# Patient Record
Sex: Female | Born: 1970
Health system: Southern US, Community
[De-identification: ages and names within clinical notes are randomized; demographics above are authoritative.]

## PROBLEM LIST (undated history)

## (undated) DIAGNOSIS — F329 Major depressive disorder, single episode, unspecified: Secondary | ICD-10-CM

## (undated) DIAGNOSIS — I10 Essential (primary) hypertension: Secondary | ICD-10-CM

## (undated) DIAGNOSIS — B019 Varicella without complication: Secondary | ICD-10-CM

## (undated) DIAGNOSIS — R7611 Nonspecific reaction to tuberculin skin test without active tuberculosis: Secondary | ICD-10-CM

## (undated) DIAGNOSIS — F32A Depression, unspecified: Secondary | ICD-10-CM

## (undated) DIAGNOSIS — Z113 Encounter for screening for infections with a predominantly sexual mode of transmission: Secondary | ICD-10-CM

## (undated) HISTORY — DX: Depression, unspecified: F32.A

## (undated) HISTORY — DX: Encounter for screening for infections with a predominantly sexual mode of transmission: Z11.3

## (undated) HISTORY — DX: Varicella without complication: B01.9

## (undated) HISTORY — PX: OOPHORECTOMY: SHX86

## (undated) HISTORY — DX: Major depressive disorder, single episode, unspecified: F32.9

## (undated) HISTORY — DX: Nonspecific reaction to tuberculin skin test without active tuberculosis: R76.11

---

## 1995-07-15 HISTORY — PX: BREAST BIOPSY: SHX20

## 1999-04-27 ENCOUNTER — Emergency Department (HOSPITAL_COMMUNITY): Admission: EM | Admit: 1999-04-27 | Discharge: 1999-04-27 | Payer: Self-pay | Admitting: *Deleted

## 1999-05-08 ENCOUNTER — Emergency Department (HOSPITAL_COMMUNITY): Admission: EM | Admit: 1999-05-08 | Discharge: 1999-05-08 | Payer: Self-pay | Admitting: Emergency Medicine

## 1999-09-09 ENCOUNTER — Other Ambulatory Visit: Admission: RE | Admit: 1999-09-09 | Discharge: 1999-09-09 | Payer: Self-pay | Admitting: Obstetrics

## 2000-08-22 ENCOUNTER — Encounter: Payer: Self-pay | Admitting: Emergency Medicine

## 2000-08-22 ENCOUNTER — Emergency Department (HOSPITAL_COMMUNITY): Admission: EM | Admit: 2000-08-22 | Discharge: 2000-08-22 | Payer: Self-pay | Admitting: Emergency Medicine

## 2000-10-25 ENCOUNTER — Ambulatory Visit (HOSPITAL_COMMUNITY): Admission: RE | Admit: 2000-10-25 | Discharge: 2000-10-25 | Payer: Self-pay | Admitting: Orthopedic Surgery

## 2000-10-25 ENCOUNTER — Encounter: Payer: Self-pay | Admitting: Orthopedic Surgery

## 2001-04-19 ENCOUNTER — Other Ambulatory Visit: Admission: RE | Admit: 2001-04-19 | Discharge: 2001-04-19 | Payer: Self-pay | Admitting: Emergency Medicine

## 2003-09-08 ENCOUNTER — Emergency Department (HOSPITAL_COMMUNITY): Admission: EM | Admit: 2003-09-08 | Discharge: 2003-09-08 | Payer: Self-pay | Admitting: Emergency Medicine

## 2006-05-09 ENCOUNTER — Emergency Department (HOSPITAL_COMMUNITY): Admission: EM | Admit: 2006-05-09 | Discharge: 2006-05-09 | Payer: Self-pay | Admitting: *Deleted

## 2009-01-01 ENCOUNTER — Other Ambulatory Visit: Admission: RE | Admit: 2009-01-01 | Discharge: 2009-01-01 | Payer: Self-pay | Admitting: Family Medicine

## 2010-01-31 ENCOUNTER — Other Ambulatory Visit: Admission: RE | Admit: 2010-01-31 | Discharge: 2010-01-31 | Payer: Self-pay | Admitting: *Deleted

## 2010-06-19 ENCOUNTER — Ambulatory Visit
Admission: AD | Admit: 2010-06-19 | Discharge: 2010-06-19 | Payer: Self-pay | Source: Home / Self Care | Admitting: Obstetrics and Gynecology

## 2010-07-14 DEATH — deceased

## 2010-09-24 LAB — CBC
HCT: 38.8 % (ref 36.0–46.0)
Hemoglobin: 13.4 g/dL (ref 12.0–15.0)
MCH: 34.9 pg — ABNORMAL HIGH (ref 26.0–34.0)
MCHC: 34.5 g/dL (ref 30.0–36.0)
MCV: 101.3 fL — ABNORMAL HIGH (ref 78.0–100.0)
Platelets: 229 10*3/uL (ref 150–400)
RBC: 3.83 MIL/uL — ABNORMAL LOW (ref 3.87–5.11)
RDW: 13.1 % (ref 11.5–15.5)
WBC: 7 10*3/uL (ref 4.0–10.5)

## 2010-11-12 ENCOUNTER — Ambulatory Visit (HOSPITAL_COMMUNITY): Admission: RE | Admit: 2010-11-12 | Payer: Self-pay | Source: Home / Self Care | Admitting: Obstetrics & Gynecology

## 2011-05-26 ENCOUNTER — Other Ambulatory Visit: Payer: Self-pay | Admitting: Obstetrics & Gynecology

## 2013-12-28 ENCOUNTER — Other Ambulatory Visit: Payer: Self-pay | Admitting: Infectious Disease

## 2013-12-28 ENCOUNTER — Ambulatory Visit
Admission: RE | Admit: 2013-12-28 | Discharge: 2013-12-28 | Disposition: A | Discharge status: Home / Self Care | Payer: No Typology Code available for payment source | Source: Ambulatory Visit | Attending: Infectious Disease | Admitting: Infectious Disease

## 2013-12-28 DIAGNOSIS — R7611 Nonspecific reaction to tuberculin skin test without active tuberculosis: Secondary | ICD-10-CM

## 2014-06-03 ENCOUNTER — Encounter (HOSPITAL_COMMUNITY): Payer: Self-pay | Admitting: Nurse Practitioner

## 2014-06-03 ENCOUNTER — Emergency Department (HOSPITAL_COMMUNITY)
Admission: EM | Admit: 2014-06-03 | Discharge: 2014-06-03 | Disposition: A | Discharge status: Home / Self Care | Payer: 59 | Attending: Emergency Medicine | Admitting: Emergency Medicine

## 2014-06-03 DIAGNOSIS — Z79899 Other long term (current) drug therapy: Secondary | ICD-10-CM | POA: Insufficient documentation

## 2014-06-03 DIAGNOSIS — M79605 Pain in left leg: Secondary | ICD-10-CM | POA: Insufficient documentation

## 2014-06-03 MED ORDER — CYCLOBENZAPRINE HCL 10 MG PO TABS: 10.0000 mg | ORAL_TABLET | Freq: Two times a day (BID) | ORAL | Status: DC | PRN

## 2014-06-03 MED ORDER — MELOXICAM 7.5 MG PO TABS: 7.5000 mg | ORAL_TABLET | Freq: Every day | ORAL | Status: DC

## 2014-06-03 NOTE — ED Provider Notes (Signed)
CSN: [id number]     Arrival date & time 06/03/14  1853 History  This chart was scribed for non-physician practitioner working with No att. providers found by Elveria Risingimelie Horne, ED Scribe. This patient was seen in room TR08C/TR08C and the patient's care was started at 8:07 PM.   Chief Complaint  Patient presents with  . Leg Pain   The history is provided by the patient. No language interpreter was used.   HPI Comments: Allison Hoffman is a 43 y.o. female who presents to the Emergency Department complaining of left knee pain which developed after performing jumping lunges at the gym one week ago. The following day patient reports running which exacerbated the pain. Patient reports treatment with heat, rest, and elevation at home, but denies relief.  Pain is throbbing, 2/10, constant. Patient reports consoltation with a nursing line at home; she was advised to have a medical evaluation if her pain did not improve. Patient presents with a mass protruding from a lateral muscle above her knee. Patient reports pain with attempted exercise, specifically "hunching lunges."  History reviewed. No pertinent past medical history. Past Surgical History  Procedure Laterality Date  . Oophorectomy     History reviewed. No pertinent family history. History  Substance Use Topics  . Smoking status: Never Smoker   . Smokeless tobacco: Not on file  . Alcohol Use: No   OB History    No data available     Review of Systems  Constitutional: Negative for fever and chills.  Musculoskeletal: Positive for myalgias.  Neurological: Negative for weakness and numbness.      Allergies  Tylenol with codeine #3  Home Medications   Prior to Admission medications   Medication Sig Start Date End Date Taking? Authorizing Provider  Norgestimate-Ethinyl Estradiol Triphasic (ORTHO TRI-CYCLEN, 28,) 0.18/0.215/0.25 MG-35 MCG tablet Take 1 tablet by mouth daily.   Yes Historical Provider, MD  cyclobenzaprine (FLEXERIL) 10  MG tablet Take 1 tablet (10 mg total) by mouth 2 (two) times daily as needed for muscle spasms. 06/03/14   Junius FinnerErin O'Malley, PA-C  meloxicam (MOBIC) 7.5 MG tablet Take 1 tablet (7.5 mg total) by mouth daily. Take 1-2 tabs daily for 5 days, then daily as needed for pain and swelling 06/03/14   Junius FinnerErin O'Malley, PA-C   Triage Vitals: BP 150/96 mmHg  Pulse 69  Temp(Src) 98.6 F (37 C) (Oral)  Resp 16  Ht 5\' 3"  (1.6 m)  Wt 138 lb (62.596 kg)  BMI 24.45 kg/m2  SpO2 100%  LMP 05/08/2014 Physical Exam  Constitutional: She is oriented to person, place, and time. She appears well-developed and well-nourished. No distress.  HENT:  Head: Normocephalic and atraumatic.  Eyes: EOM are normal.  Neck: Neck supple. No tracheal deviation present.  Cardiovascular: Normal rate.   Pulmonary/Chest: Effort normal. No respiratory distress.  Musculoskeletal: Normal range of motion.  Left leg: 1 cm area of edema with tenderness consistent with soft tissue swelling. No hard mass. No bony tenderness. Full ROM of left knee. Patient able to bear weight on left leg.   Neurological: She is alert and oriented to person, place, and time.  Skin: Skin is warm and dry.  Psychiatric: She has a normal mood and affect. Her behavior is normal.  Nursing note and vitals reviewed.   ED Course  Procedures (including critical care time)  COORDINATION OF CARE: 8:31 PM- Plans to discharge with knee sleeve. Patient advised to cease exercises that cause her knee pain. Discussed treatment plan with  patient at bedside and patient agreed to plan.   Labs Review Labs Reviewed - No data to display  Imaging Review No results found.   EKG Interpretation None      MDM   Final diagnoses:  Left leg pain    Pt c/o left leg pain after increased intensity of workout. No bony tenderness. Left leg neurovascularly in tact.  Do not believe imaging needed at this time. Not concerned for emergent process taking place. Will tx  symptomatically as needed for pain. Rx: knee sleeve, flexeril and mobic. Advised to f/u with Dr. Eulah PontMurphy, or previously established orthopedist in 1-2 weeks if not improving. Pt verbalized understanding and agreement with tx plan.   I personally performed the services described in this documentation, which was scribed in my presence. The recorded information has been reviewed and is accurate.    Junius Finnerrin O'Malley, PA-C 06/04/14 0013  Elwin MochaBlair Walden, MD 06/04/14 [phone]

## 2014-06-03 NOTE — ED Notes (Signed)
She was doing lunges at the gym on Monday and began to have L upper leg pain. After running on Tuesday the pain became severe. She has tried elevation and heat at home with no relief of the pain

## 2015-01-19 ENCOUNTER — Telehealth: Payer: Self-pay | Admitting: Hematology & Oncology

## 2015-01-19 NOTE — Telephone Encounter (Signed)
Contacted [facility] referring office spoke with Amy regarding new pt referral and she could not locate the pt file.  Amy will research and contact me with more info.

## 2015-04-15 ENCOUNTER — Emergency Department (INDEPENDENT_AMBULATORY_CARE_PROVIDER_SITE_OTHER)
Admission: EM | Admit: 2015-04-15 | Discharge: 2015-04-15 | Disposition: A | Discharge status: Home / Self Care | Payer: 59 | Source: Home / Self Care | Attending: Emergency Medicine | Admitting: Emergency Medicine

## 2015-04-15 ENCOUNTER — Encounter (HOSPITAL_COMMUNITY): Payer: Self-pay | Admitting: Emergency Medicine

## 2015-04-15 DIAGNOSIS — R197 Diarrhea, unspecified: Secondary | ICD-10-CM

## 2015-04-15 MED ORDER — CIPROFLOXACIN HCL 500 MG PO TABS: 500.0000 mg | ORAL_TABLET | Freq: Two times a day (BID) | ORAL | Status: DC

## 2015-04-15 MED ORDER — METRONIDAZOLE 500 MG PO TABS: 500.0000 mg | ORAL_TABLET | Freq: Two times a day (BID) | ORAL | Status: DC

## 2015-04-15 MED ORDER — DIPHENOXYLATE-ATROPINE 2.5-0.025 MG PO TABS: 1.0000 | ORAL_TABLET | Freq: Four times a day (QID) | ORAL | Status: DC | PRN

## 2015-04-15 NOTE — ED Provider Notes (Signed)
CSN: [id number]     Arrival date & time 04/15/15  1940 History   First MD Initiated Contact with Patient 04/15/15 2028     Chief Complaint  Patient presents with  . Diarrhea   (Consider location/radiation/quality/duration/timing/severity/associated sxs/prior Treatment) Patient is a 44 y.o. female presenting with diarrhea. The history is provided by the patient. No language interpreter was used.  Diarrhea Quality:  Watery Severity:  Severe Onset quality:  Sudden Number of episodes:  Multiple Duration:  5 days Timing:  Constant Progression:  Worsening Relieved by:  Nothing Worsened by:  Nothing tried Ineffective treatments:  None tried Associated symptoms: no abdominal pain   Risk factors: suspect food intake   Risk factors: no recent antibiotic use   Pt became sick after going to [place].  Pt is suspicious of yogurt and salad.  History reviewed. No pertinent past medical history. Past Surgical History  Procedure Laterality Date  . Oophorectomy     No family history on file. Social History  Substance Use Topics  . Smoking status: Never Smoker   . Smokeless tobacco: None  . Alcohol Use: No   OB History    No data available     Review of Systems  Gastrointestinal: Positive for diarrhea. Negative for abdominal pain.  All other systems reviewed and are negative.   Allergies  Tylenol with codeine #3  Home Medications   Prior to Admission medications   Medication Sig Start Date End Date Taking? Authorizing Provider  ciprofloxacin (CIPRO) 500 MG tablet Take 1 tablet (500 mg total) by mouth 2 (two) times daily. 04/15/15   Elson Areas, PA-C  cyclobenzaprine (FLEXERIL) 10 MG tablet Take 1 tablet (10 mg total) by mouth 2 (two) times daily as needed for muscle spasms. 06/03/14   Junius Finner, PA-C  diphenoxylate-atropine (LOMOTIL) 2.5-0.025 MG tablet Take 1 tablet by mouth 4 (four) times daily as needed for diarrhea or loose stools. 04/15/15   Elson Areas, PA-C   meloxicam (MOBIC) 7.5 MG tablet Take 1 tablet (7.5 mg total) by mouth daily. Take 1-2 tabs daily for 5 days, then daily as needed for pain and swelling 06/03/14   Junius Finner, PA-C  metroNIDAZOLE (FLAGYL) 500 MG tablet Take 1 tablet (500 mg total) by mouth 2 (two) times daily. 04/15/15   Elson Areas, PA-C  Norgestimate-Ethinyl Estradiol Triphasic (ORTHO TRI-CYCLEN, 28,) 0.18/0.215/0.25 MG-35 MCG tablet Take 1 tablet by mouth daily.    Historical Provider, MD   Meds Ordered and Administered this Visit  Medications - No data to display  BP 155/97 mmHg  Pulse 68  Temp(Src) 98.1 F (36.7 C) (Oral)  Resp 18  SpO2 100%  LMP 04/15/2015 No data found.   Physical Exam  Constitutional: She is oriented to person, place, and time. She appears well-developed and well-nourished.  HENT:  Head: Normocephalic and atraumatic.  Right Ear: External ear normal.  Left Ear: External ear normal.  Nose: Nose normal.  Mouth/Throat: Oropharynx is clear and moist.  Eyes: Conjunctivae and EOM are normal. Pupils are equal, round, and reactive to light.  Neck: Normal range of motion.  Cardiovascular: Normal rate and normal heart sounds.   Pulmonary/Chest: Effort normal.  Abdominal: Soft. Bowel sounds are normal. She exhibits no distension.  Musculoskeletal: Normal range of motion.  Neurological: She is alert and oriented to person, place, and time.  Skin: Skin is warm.  Psychiatric: She has a normal mood and affect.  Nursing note and vitals reviewed.   ED Course  Procedures (including critical care time)  Labs Review Labs Reviewed  GI PATHOGEN PANEL BY PCR, STOOL    Imaging Review No results found.   Visual Acuity Review  Right Eye Distance:   Left Eye Distance:   Bilateral Distance:    Right Eye Near:   Left Eye Near:    Bilateral Near:         MDM  Pt will obtain stool specimin.   I discussed options with pt.  She will return stool.   I am going to treat for possible  infectious diahrea.   Pt to return in 2 days for recheck   1. Diarrhea, unspecified type    cipro Flagyl lomotil   Elson Areas, PA-C 04/15/15 2128  Lonia Skinner [place], [place] 04/15/15 2130

## 2015-04-15 NOTE — ED Notes (Signed)
Pt here with c/o diarrhea that started while on vacation last Thursday Noticed sx's after eating [employer] yogurt  No otc used Denies fever,chills X 1 episode of vomiting mentioned BP elevated 173/100, recheck 193/98 dizziness notes Family hx HTN, not taking medication

## 2015-04-15 NOTE — Discharge Instructions (Signed)

## 2015-04-19 ENCOUNTER — Encounter (HOSPITAL_COMMUNITY): Payer: Self-pay | Admitting: *Deleted

## 2015-04-19 ENCOUNTER — Emergency Department (INDEPENDENT_AMBULATORY_CARE_PROVIDER_SITE_OTHER)
Admission: EM | Admit: 2015-04-19 | Discharge: 2015-04-19 | Disposition: A | Discharge status: Home / Self Care | Payer: 59 | Source: Home / Self Care

## 2015-04-19 DIAGNOSIS — R197 Diarrhea, unspecified: Secondary | ICD-10-CM | POA: Diagnosis not present

## 2015-04-19 MED ORDER — FLUCONAZOLE 150 MG PO TABS: 150.0000 mg | ORAL_TABLET | Freq: Every day | ORAL | Status: DC

## 2015-04-19 NOTE — ED Provider Notes (Signed)
CSN: [id number] date & time 04/19/15  1300 History   None    Chief Complaint  Patient presents with  . Diarrhea   (Consider location/radiation/quality/duration/timing/severity/associated sxs/prior Treatment) HPI  Diarrhea. Initially started 1 week ago when patient ate some yogurt that she thinks was bath. Diarrhea started approximately 1 hour after eating the yogurt. She developed increasingly frequent loose watery stools over the next 3 days. She was seen here on 04/15/2015 and was prescribed metronidazole and Flagyl. Patient started these medications and became essentially diarrhea free for 2 days. However she is developed some subsequent nausea and new diarrhea. Yesterday she had 3-4 episodes of diarrhea today she had to. Denies any hematochezia, melena, emesis, fevers, abdominal pain, dysuria, frequency.   History reviewed. No pertinent past medical history. Past Surgical History  Procedure Laterality Date  . Oophorectomy     History reviewed. No pertinent family history. Social History  Substance Use Topics  . Smoking status: Never Smoker   . Smokeless tobacco: None  . Alcohol Use: No   OB History    No data available     Review of Systems Per HPI with all other pertinent systems negative.   Allergies  Tylenol with codeine #3  Home Medications   Prior to Admission medications   Medication Sig Start Date End Date Taking? Authorizing Provider  ciprofloxacin (CIPRO) 500 MG tablet Take 1 tablet (500 mg total) by mouth 2 (two) times daily. 04/15/15  Yes Lonia Skinner Sofia, PA-C  cyclobenzaprine (FLEXERIL) 10 MG tablet Take 1 tablet (10 mg total) by mouth 2 (two) times daily as needed for muscle spasms. 06/03/14  Yes Junius Finner, PA-C  diphenoxylate-atropine (LOMOTIL) 2.5-0.025 MG tablet Take 1 tablet by mouth 4 (four) times daily as needed for diarrhea or loose stools. 04/15/15  Yes Lonia Skinner Sofia, PA-C  meloxicam (MOBIC) 7.5 MG tablet Take 1 tablet (7.5 mg total) by  mouth daily. Take 1-2 tabs daily for 5 days, then daily as needed for pain and swelling 06/03/14  Yes Junius Finner, PA-C  metroNIDAZOLE (FLAGYL) 500 MG tablet Take 1 tablet (500 mg total) by mouth 2 (two) times daily. 04/15/15  Yes Elson Areas, PA-C  Norgestimate-Ethinyl Estradiol Triphasic (ORTHO TRI-CYCLEN, 28,) 0.18/0.215/0.25 MG-35 MCG tablet Take 1 tablet by mouth daily.    Historical Provider, MD   Meds Ordered and Administered this Visit  Medications - No data to display  BP 161/74 mmHg  Pulse 74  Temp(Src) 98 F (36.7 C) (Oral)  Resp 16  SpO2 100%  LMP 04/15/2015 No data found.   Physical Exam Physical Exam  Constitutional: oriented to person, place, and time. appears well-developed and well-nourished. No distress.  HENT:  Head: Normocephalic and atraumatic.  Eyes: EOMI. PERRL.  Neck: Normal range of motion.  Cardiovascular: RRR, no m/r/g, 2+ distal pulses,  Pulmonary/Chest: Effort normal and breath sounds normal. No respiratory distress.  Abdominal: Soft. Bowel sounds are normal. NonTTP, no distension.  Musculoskeletal: Normal range of motion. Non ttp, no effusion.  Neurological: alert and oriented to person, place, and time.  Skin: Skin is warm. No rash noted. non diaphoretic.  Psychiatric: normal mood and affect. behavior is normal. Judgment and thought content normal.   ED Course  Procedures (including critical care time)  Labs Review Labs Reviewed - No data to display  Imaging Review No results found.   Visual Acuity Review  Right Eye Distance:   Left Eye Distance:   Bilateral Distance:  Right Eye Near:   Left Eye Near:    Bilateral Near:         MDM   1. Diarrhea, unspecified type    Suspect patient's initial infection was treated with the antibiotics however she was probably overtreated. Recommending at this time the patient stopped her metronidazole but continue the supportive floxacillin. Patient aware that if her symptoms worsen or  develops abdominal pain or any kind of fevers she is to be seen in the emergency room. Patient may consider reentering the metronidazole and stopping the Flagyl if her symptoms are still not improving. Diflucan per prescription and given if patient develops yeast infection.    Ozella Rocks, MD 04/19/15 [phone]

## 2015-04-19 NOTE — Discharge Instructions (Signed)
Your diarrhea likely initially improved as the antibiotic squirt to kill the initial infection is now worsening due to overmedication. Please stop the metronidazole and continue the ciprofloxacin. This needs to continue for a total of 7 days. If your symptoms do not improve or get worse we stop this medicine and use the metronidazole. Presumed emergency room if you get significantly worse. Please start a probiotic

## 2015-04-19 NOTE — ED Notes (Signed)
Pt returns after being here 4 days ago with diarrhea.  It is some better, but not resolved.  She denies fever

## 2015-08-28 ENCOUNTER — Other Ambulatory Visit: Payer: Self-pay | Admitting: Obstetrics & Gynecology

## 2015-08-30 LAB — CYTOLOGY - PAP

## 2016-01-17 ENCOUNTER — Emergency Department (HOSPITAL_COMMUNITY): Payer: 59

## 2016-01-17 ENCOUNTER — Emergency Department (HOSPITAL_COMMUNITY)
Admission: EM | Admit: 2016-01-17 | Discharge: 2016-01-18 | Disposition: A | Discharge status: Home / Self Care | Payer: 59 | Attending: Emergency Medicine | Admitting: Emergency Medicine

## 2016-01-17 ENCOUNTER — Encounter (HOSPITAL_COMMUNITY): Payer: Self-pay | Admitting: Emergency Medicine

## 2016-01-17 DIAGNOSIS — I10 Essential (primary) hypertension: Secondary | ICD-10-CM | POA: Diagnosis not present

## 2016-01-17 DIAGNOSIS — R0600 Dyspnea, unspecified: Secondary | ICD-10-CM | POA: Insufficient documentation

## 2016-01-17 DIAGNOSIS — M79605 Pain in left leg: Secondary | ICD-10-CM | POA: Diagnosis not present

## 2016-01-17 DIAGNOSIS — N926 Irregular menstruation, unspecified: Secondary | ICD-10-CM | POA: Insufficient documentation

## 2016-01-17 DIAGNOSIS — R0602 Shortness of breath: Secondary | ICD-10-CM | POA: Diagnosis present

## 2016-01-17 DIAGNOSIS — Z79899 Other long term (current) drug therapy: Secondary | ICD-10-CM | POA: Insufficient documentation

## 2016-01-17 HISTORY — DX: Essential (primary) hypertension: I10

## 2016-01-17 LAB — BASIC METABOLIC PANEL
Anion gap: 9 (ref 5–15)
BUN: 9 mg/dL (ref 6–20)
CO2: 21 mmol/L — ABNORMAL LOW (ref 22–32)
Calcium: 9.4 mg/dL (ref 8.9–10.3)
Chloride: 108 mmol/L (ref 101–111)
Creatinine, Ser: 0.85 mg/dL (ref 0.44–1.00)
GFR calc Af Amer: >60 mL/min (ref >60)
Glucose, Bld: 106 mg/dL — ABNORMAL HIGH (ref 65–99)
POTASSIUM: 3 mmol/L — AB (ref 3.5–5.1)
SODIUM: 138 mmol/L (ref 135–145)

## 2016-01-17 LAB — I-STAT TROPONIN, ED: Troponin i, poc: 0.01 ng/mL (ref 0.00–0.08)

## 2016-01-17 LAB — CBC
HCT: 35.7 % — ABNORMAL LOW (ref 36.0–46.0)
Hemoglobin: 12.7 g/dL (ref 12.0–15.0)
MCH: 34.1 pg — ABNORMAL HIGH (ref 26.0–34.0)
MCHC: 35.6 g/dL (ref 30.0–36.0)
MCV: 96 fL (ref 78.0–100.0)
PLATELETS: 282 10*3/uL (ref 150–400)
RBC: 3.72 MIL/uL — ABNORMAL LOW (ref 3.87–5.11)
RDW: 12.1 % (ref 11.5–15.5)
WBC: 7.8 10*3/uL (ref 4.0–10.5)

## 2016-01-17 NOTE — ED Provider Notes (Signed)
CSN: [id number]     Arrival date & time 01/17/16  2159 History  By signing my name below, I, Doreatha Martin, attest that this documentation has been prepared under the direction and in the presence of Azalia Bilis, MD. Electronically Signed: Doreatha Martin, ED Scribe. 01/18/2016. 12:37 AM.    Chief Complaint  Patient presents with  . Shortness of Breath  . Leg Swelling   The history is provided by the patient. No language interpreter was used.   HPI Comments: Allison Hoffman is a 45 y.o. female with h/o HTN who presents to the Emergency Department complaining of gradually worsening SOB onset this week and worsened today. She reports that her SOB is alleviated with rest and unaffected by walking or lying flat. Pt does note that her SOB was exacerbated by working out at the gym today. Pt states that she feels as though she must take a deep breath to alleviate her symptoms. Pt also complains of left upper leg heaviness, soreness and swelling onset last week. Pt states her left leg heaviness was also exacerbated today after working out at the gym. Pt also reports one single episode of palpitations today, but denies any additional episodes this week. Per pt, she was recently started on amlodipine last week and reports her leg swelling began after beginning this medication. No FHx of PV/DVT. Pt states her menstrual cycles are irregular and she takes oral contraceptives. She is a nonsmoker. Pt is followed by Dr. Abigail Miyamoto with [facility] for HTN. She denies cough, fever, sore throat, nasal congestion, chest discomfort or pain, lower extremity weakness.   Past Medical History  Diagnosis Date  . Hypertension    Past Surgical History  Procedure Laterality Date  . Oophorectomy     History reviewed. No pertinent family history. Social History  Substance Use Topics  . Smoking status: Never Smoker   . Smokeless tobacco: None  . Alcohol Use: No   OB History    No data available     Review of Systems A  complete 10 system review of systems was obtained and all systems are negative except as noted in the HPI and PMH.    Allergies  Tylenol with codeine #3  Home Medications   Prior to Admission medications   Medication Sig Start Date End Date Taking? Authorizing Provider  ciprofloxacin (CIPRO) 500 MG tablet Take 1 tablet (500 mg total) by mouth 2 (two) times daily. 04/15/15   Elson Areas, PA-C  cyclobenzaprine (FLEXERIL) 10 MG tablet Take 1 tablet (10 mg total) by mouth 2 (two) times daily as needed for muscle spasms. 06/03/14   Junius Finner, PA-C  diphenoxylate-atropine (LOMOTIL) 2.5-0.025 MG tablet Take 1 tablet by mouth 4 (four) times daily as needed for diarrhea or loose stools. 04/15/15   Elson Areas, PA-C  fluconazole (DIFLUCAN) 150 MG tablet Take 1 tablet (150 mg total) by mouth daily. Repeat dose in 3 days 04/19/15   Ozella Rocks, MD  meloxicam (MOBIC) 7.5 MG tablet Take 1 tablet (7.5 mg total) by mouth daily. Take 1-2 tabs daily for 5 days, then daily as needed for pain and swelling 06/03/14   Junius Finner, PA-C  metroNIDAZOLE (FLAGYL) 500 MG tablet Take 1 tablet (500 mg total) by mouth 2 (two) times daily. 04/15/15   Elson Areas, PA-C  Norgestimate-Ethinyl Estradiol Triphasic (ORTHO TRI-CYCLEN, 28,) 0.18/0.215/0.25 MG-35 MCG tablet Take 1 tablet by mouth daily.    Historical Provider, MD   BP 146/90 mmHg  Pulse 96  Resp 19  SpO2 100%  LMP 01/10/2016 (Exact Date) Physical Exam  Constitutional: She is oriented to person, place, and time. She appears well-developed and well-nourished. No distress.  HENT:  Head: Normocephalic and atraumatic.  Eyes: EOM are normal.  Neck: Normal range of motion.  Cardiovascular: Normal rate, regular rhythm, normal heart sounds and intact distal pulses.   Normal PT and DP pulse in the left foot.   Pulmonary/Chest: Effort normal and breath sounds normal.  Abdominal: Soft. She exhibits no distension. There is no tenderness.  Musculoskeletal:  Normal range of motion. She exhibits no edema.  LLE without swelling as compared to the right.   Neurological: She is alert and oriented to person, place, and time.  Skin: Skin is warm and dry.  Psychiatric: She has a normal mood and affect. Judgment normal.  Nursing note and vitals reviewed.   ED Course  Procedures (including critical care time) DIAGNOSTIC STUDIES: Oxygen Saturation is 98% on RA, normal by my interpretation.    COORDINATION OF CARE: 12:35 AM Discussed treatment plan with pt at bedside which includes lab work, CXR, D-dimer and pt agreed to plan.   Labs Review Labs Reviewed  BASIC METABOLIC PANEL - Abnormal; Notable for the following:    Potassium 3.0 (*)    CO2 21 (*)    Glucose, Bld 106 (*)    All other components within normal limits  CBC - Abnormal; Notable for the following:    RBC 3.72 (*)    HCT 35.7 (*)    MCH 34.1 (*)    All other components within normal limits  D-DIMER, QUANTITATIVE (NOT AT [facility])  I-STAT TROPOININ, ED  I-STAT BETA HCG BLOOD, ED ([facility], [facility], [facility] ONLY)    Imaging Review Dg Chest 2 View  01/17/2016  CLINICAL DATA:  Acute onset of shortness of breath and left-sided leg swelling. Initial encounter. EXAM: CHEST  2 VIEW COMPARISON:  None. FINDINGS: The lungs are well-aerated and clear. There is no evidence of focal opacification, pleural effusion or pneumothorax. The heart is normal in size; the mediastinal contour is within normal limits. No acute osseous abnormalities are seen. IMPRESSION: No acute cardiopulmonary process seen. Electronically Signed   By: Roanna RaiderJeffery  Chang M.D.   On: 01/17/2016 23:08   I have personally reviewed and evaluated these images and lab results as part of my medical decision-making.   EKG Interpretation   Date/Time:  Thursday January 17 2016 22:08:04 EDT Ventricular Rate:  95 PR Interval:  138 QRS Duration: 76 QT Interval:  378 QTC Calculation: 475 R Axis:   75 Text Interpretation:  Normal sinus rhythm Nonspecific  ST abnormality  Abnormal ECG No old tracing to compare Confirmed by Bakari Nikolai  MD, Aeliana Spates  ([id number]) on 01/18/2016 12:34:03 AM      MDM   Final diagnoses:  Dyspnea  Left leg pain    Elevated d-dimer and thus T Angier perfored which demonstrtes no evidence of acute pulmonary embolim.  She does continue to complain of some left leg pain and swelling and therefore she will be sent to the vascular lab in the morning for duplex of her left l She unders to return to symptoms.  s single dosenox was given in the emerge Outpatient venous duplex arranged. Normal pulses in left.  Doubt an arterial issue.  She does seem somewhat anxious  I personally performed the services described in this documentation, which was scribed in my presence. The recorded information has been reviewed and is  accurate.       Azalia BilisKevin Onica Davidovich, MD 01/18/16 [phone]

## 2016-01-17 NOTE — ED Notes (Signed)
Patient arrives with complaint of left thigh swelling and pain coupled with shortness of breath. Patient states she has been battling with HTN and was recently placed on amlodipine. Patient reports that since starting the medication she has felt swelling in lower extremities and at times some shortness of breath. Today she was walking and noticed she felt much worse. Explains that her leg was hurting badly enough that she had stop. Also explains that over the past few days she has felt as though she "can't get a good breath". Pt noted to be taking deliberate breaths in triage, but doesn't appear in distress. Denies other medical history. No obvious swelling to LL extremity, but patient states it feels tight when she stands on it.

## 2016-01-18 ENCOUNTER — Emergency Department (HOSPITAL_COMMUNITY): Payer: 59

## 2016-01-18 ENCOUNTER — Ambulatory Visit (HOSPITAL_BASED_OUTPATIENT_CLINIC_OR_DEPARTMENT_OTHER)
Admission: RE | Admit: 2016-01-18 | Discharge: 2016-01-18 | Disposition: A | Discharge status: Home / Self Care | Payer: 59 | Source: Ambulatory Visit | Attending: Emergency Medicine | Admitting: Emergency Medicine

## 2016-01-18 DIAGNOSIS — M79609 Pain in unspecified limb: Secondary | ICD-10-CM

## 2016-01-18 LAB — D-DIMER, QUANTITATIVE: D-Dimer, Quant: 0.68 ug/mL-FEU — ABNORMAL HIGH (ref 0.00–0.50)

## 2016-01-18 LAB — I-STAT BETA HCG BLOOD, ED (MC, WL, AP ONLY)

## 2016-01-18 MED ORDER — POTASSIUM CHLORIDE CRYS ER 20 MEQ PO TBCR: 20.0000 meq | EXTENDED_RELEASE_TABLET | Freq: Two times a day (BID) | ORAL | Status: DC

## 2016-01-18 MED ORDER — IOPAMIDOL (ISOVUE-370) INJECTION 76%
INTRAVENOUS | Status: AC
  Administered 2016-01-18: 80 mL
  Filled 2016-01-18: qty 100

## 2016-01-18 MED ORDER — POTASSIUM CHLORIDE CRYS ER 20 MEQ PO TBCR
40.0000 meq | EXTENDED_RELEASE_TABLET | Freq: Once | ORAL | Status: AC
Start: 2016-01-18 — End: 2016-01-18
  Administered 2016-01-18: 40 meq via ORAL
  Filled 2016-01-18: qty 2

## 2016-01-18 MED ORDER — ENOXAPARIN SODIUM 60 MG/0.6ML ~~LOC~~ SOLN
60.0000 mg | Freq: Once | SUBCUTANEOUS | Status: AC
  Administered 2016-01-18: 60 mg via SUBCUTANEOUS
  Filled 2016-01-18: qty 0.6

## 2016-01-18 NOTE — Progress Notes (Signed)
Preliminary results by tech - Left Lower Ext. Venous Duplex Completed. Negative for deep and superficial vein thrombosis.  Carrina Schoenberger, BS, RDMS, RVT  

## 2016-01-18 NOTE — ED Notes (Addendum)
Pt departed in NAD, refused use of wheelchair.  

## 2016-01-18 NOTE — ED Notes (Signed)
Patient transported to CT 

## 2016-01-18 NOTE — Discharge Instructions (Signed)
IMPORTANT PATIENT INSTRUCTIONS:  ° °You have been scheduled for an Outpatient Vascular Study at [facility].   ° °If tomorrow is a Saturday or Sunday, please go to the [facility] registration desk at 8 AM tomorrow morning and tell them you are therefore a vascular study. ° °If tomorrow is a weekday (Monday - Friday), please go to the [facility] at 8 AM and tell them you are therefore a vascular study ° ° °

## 2016-05-30 ENCOUNTER — Other Ambulatory Visit: Payer: Self-pay | Admitting: Family Medicine

## 2016-05-30 DIAGNOSIS — H538 Other visual disturbances: Secondary | ICD-10-CM

## 2016-06-25 ENCOUNTER — Inpatient Hospital Stay: Admission: RE | Admit: 2016-06-25 | Payer: Self-pay | Source: Ambulatory Visit

## 2016-08-20 ENCOUNTER — Ambulatory Visit (HOSPITAL_COMMUNITY)
Admission: EM | Admit: 2016-08-20 | Discharge: 2016-08-20 | Disposition: A | Discharge status: Home / Self Care | Payer: 59 | Attending: Family Medicine | Admitting: Family Medicine

## 2016-08-20 ENCOUNTER — Encounter (HOSPITAL_COMMUNITY): Payer: Self-pay | Admitting: Emergency Medicine

## 2016-08-20 DIAGNOSIS — J069 Acute upper respiratory infection, unspecified: Secondary | ICD-10-CM | POA: Diagnosis not present

## 2016-08-20 DIAGNOSIS — B9789 Other viral agents as the cause of diseases classified elsewhere: Secondary | ICD-10-CM | POA: Diagnosis not present

## 2016-08-20 NOTE — Discharge Instructions (Signed)
You most likely had a viral URI. If your symptoms persist, I advise rest, plenty of fluids and management of symptoms with over the counter medicines. For symptoms you may take Tylenol as needed every 4-6 hours for body aches or fever, not to exceed 4,000 mg a day, Take mucinex or mucinex DM ever 12 hours with a full glass of water, you may use an inhaled steroid such as Flonase, 2 sprays each nostril once a day for congestion, or an antihistamine such as Claritin or Zyrtec once a day. Should your symptoms worsen or fail to resolve, follow up with your primary care provider or return to clinic.

## 2016-08-20 NOTE — ED Provider Notes (Signed)
CSN: [id number]     Arrival date & time 08/20/16  1806 History   First MD Initiated Contact with Patient 08/20/16 1901     Chief Complaint  Patient presents with  . Sore Throat  . Nasal Congestion  . Headache   (Consider location/radiation/quality/duration/timing/severity/associated sxs/prior Treatment) 46 year old female presents to clinic for evaluation. She states that 4 days ago she had sinus congestion, a sore throat, and headache, denies fever, N/V/D, loss of appetite, or other symptoms. States her symptoms mostly resolved but she still felt some "stiffness" in the muscles of her chest. Denies any other complaints   The history is provided by the patient.  Sore Throat  Associated symptoms include headaches.  Headache    Past Medical History:  Diagnosis Date  . Hypertension    Past Surgical History:  Procedure Laterality Date  . OOPHORECTOMY     No family history on file. Social History  Substance Use Topics  . Smoking status: Never Smoker  . Smokeless tobacco: Never Used  . Alcohol use No   OB History    No data available     Review of Systems  Reason unable to perform ROS: as covered in HPI.  Neurological: Positive for headaches.  All other systems reviewed and are negative.   Allergies  Tylenol with codeine #3 [acetaminophen-codeine]  Home Medications   Prior to Admission medications   Medication Sig Start Date End Date Taking? Authorizing Provider  ciprofloxacin (CIPRO) 500 MG tablet Take 1 tablet (500 mg total) by mouth 2 (two) times daily. 04/15/15   Elson AreasLeslie K Sofia, PA-C  cyclobenzaprine (FLEXERIL) 10 MG tablet Take 1 tablet (10 mg total) by mouth 2 (two) times daily as needed for muscle spasms. 06/03/14   Junius FinnerErin O'Malley, PA-C  diphenoxylate-atropine (LOMOTIL) 2.5-0.025 MG tablet Take 1 tablet by mouth 4 (four) times daily as needed for diarrhea or loose stools. 04/15/15   Elson AreasLeslie K Sofia, PA-C  fluconazole (DIFLUCAN) 150 MG tablet Take 1 tablet (150 mg  total) by mouth daily. Repeat dose in 3 days 04/19/15   Ozella Rocksavid J Merrell, MD  meloxicam (MOBIC) 7.5 MG tablet Take 1 tablet (7.5 mg total) by mouth daily. Take 1-2 tabs daily for 5 days, then daily as needed for pain and swelling 06/03/14   Junius FinnerErin O'Malley, PA-C  metroNIDAZOLE (FLAGYL) 500 MG tablet Take 1 tablet (500 mg total) by mouth 2 (two) times daily. 04/15/15   Elson AreasLeslie K Sofia, PA-C  Norgestimate-Ethinyl Estradiol Triphasic (ORTHO TRI-CYCLEN, 28,) 0.18/0.215/0.25 MG-35 MCG tablet Take 1 tablet by mouth daily.    Historical Provider, MD  potassium chloride SA (K-DUR,KLOR-CON) 20 MEQ tablet Take 1 tablet (20 mEq total) by mouth 2 (two) times daily. 01/18/16   Azalia BilisKevin Campos, MD   Meds Ordered and Administered this Visit  Medications - No data to display  BP 154/99 (BP Location: Right Arm)   Pulse 96   Temp 98.6 F (37 C) (Oral)   Resp 17   Ht 5\' 4"  (1.626 m)   Wt 130 lb (59 kg)   LMP 08/18/2016   SpO2 100%   BMI 22.31 kg/m  No data found.   Physical Exam  Constitutional: She is oriented to person, place, and time. She appears well-developed and well-nourished. She does not have a sickly appearance. She does not appear ill. No distress.  HENT:  Head: Normocephalic and atraumatic.  Right Ear: Tympanic membrane and external ear normal.  Left Ear: Tympanic membrane and external ear normal.  Nose:  Nose normal. Right sinus exhibits no maxillary sinus tenderness and no frontal sinus tenderness. Left sinus exhibits no maxillary sinus tenderness and no frontal sinus tenderness.  Mouth/Throat: Uvula is midline, oropharynx is clear and moist and mucous membranes are normal. No oropharyngeal exudate. Tonsils are 0 on the right. Tonsils are 0 on the left.  Eyes: Pupils are equal, round, and reactive to light.  Neck: Normal range of motion. Neck supple. No JVD present.  Cardiovascular: Normal rate and regular rhythm.   Pulmonary/Chest: Effort normal and breath sounds normal. No respiratory distress.  She has no wheezes.  Abdominal: Soft. Bowel sounds are normal. She exhibits no distension. There is no tenderness. There is no guarding.  Lymphadenopathy:       Head (right side): No submental, no submandibular and no tonsillar adenopathy present.       Head (left side): No submental, no submandibular and no tonsillar adenopathy present.    She has no cervical adenopathy.  Neurological: She is alert and oriented to person, place, and time.  Skin: Skin is warm and dry. Capillary refill takes less than 2 seconds. She is not diaphoretic.  Psychiatric: She has a normal mood and affect.  Nursing note and vitals reviewed.   Urgent Care Course     Procedures (including critical care time)  Labs Review Labs Reviewed - No data to display  Imaging Review No results found.   Visual Acuity Review  Right Eye Distance:   Left Eye Distance:   Bilateral Distance:    Right Eye Near:   Left Eye Near:    Bilateral Near:         MDM   1. Viral upper respiratory tract infection   You most likely had a viral URI. If your symptoms persist, I advise rest, plenty of fluids and management of symptoms with over the counter medicines. For symptoms you may take Tylenol as needed every 4-6 hours for body aches or fever, not to exceed 4,000 mg a day, Take mucinex or mucinex DM ever 12 hours with a full glass of water, you may use an inhaled steroid such as Flonase, 2 sprays each nostril once a day for congestion, or an antihistamine such as Claritin or Zyrtec once a day. Should your symptoms worsen or fail to resolve, follow up with your primary care provider or return to clinic.       Dorena Bodo, NP 08/20/16 [phone]

## 2017-09-02 ENCOUNTER — Encounter (HOSPITAL_COMMUNITY): Payer: Self-pay | Admitting: Family Medicine

## 2017-09-02 ENCOUNTER — Ambulatory Visit (HOSPITAL_COMMUNITY)
Admission: EM | Admit: 2017-09-02 | Discharge: 2017-09-02 | Disposition: A | Discharge status: Home / Self Care | Payer: 59 | Attending: Family Medicine | Admitting: Family Medicine

## 2017-09-02 DIAGNOSIS — J029 Acute pharyngitis, unspecified: Secondary | ICD-10-CM | POA: Insufficient documentation

## 2017-09-02 DIAGNOSIS — B349 Viral infection, unspecified: Secondary | ICD-10-CM

## 2017-09-02 DIAGNOSIS — R05 Cough: Secondary | ICD-10-CM | POA: Insufficient documentation

## 2017-09-02 LAB — POCT RAPID STREP A: Streptococcus, Group A Screen (Direct): NEGATIVE

## 2017-09-02 MED ORDER — FLUTICASONE PROPIONATE 50 MCG/ACT NA SUSP: 2.0000 | Freq: Every day | NASAL | 0 refills | Status: DC

## 2017-09-02 MED ORDER — IPRATROPIUM BROMIDE 0.06 % NA SOLN: 2.0000 | Freq: Four times a day (QID) | NASAL | 0 refills | Status: DC

## 2017-09-02 NOTE — ED Provider Notes (Signed)
[facility]    CSN: [id number] Arrival date & time: 09/02/17  1457     History   Chief Complaint Chief Complaint  Patient presents with  . Cough    HPI Allison Hoffman is a 47 y.o. female.   47 year old female with history of HTN comes in for 4-5-day history of URI symptoms. Nonproductive cough, sore throat. Denies rhinorrhea, nasal congestion. Denies fever, chills, night sweats. otc cold medicine without relief. Never smoker. Positive sick contact.       Past Medical History:  Diagnosis Date  . Hypertension     There are no active problems to display for this patient.   Past Surgical History:  Procedure Laterality Date  . OOPHORECTOMY      OB History    No data available       Home Medications    Prior to Admission medications   Medication Sig Start Date End Date Taking? Authorizing Provider  ciprofloxacin (CIPRO) 500 MG tablet Take 1 tablet (500 mg total) by mouth 2 (two) times daily. 04/15/15   Elson Areas, PA-C  cyclobenzaprine (FLEXERIL) 10 MG tablet Take 1 tablet (10 mg total) by mouth 2 (two) times daily as needed for muscle spasms. 06/03/14   Lurene Shadow, PA-C  diphenoxylate-atropine (LOMOTIL) 2.5-0.025 MG tablet Take 1 tablet by mouth 4 (four) times daily as needed for diarrhea or loose stools. 04/15/15   Elson Areas, PA-C  fluconazole (DIFLUCAN) 150 MG tablet Take 1 tablet (150 mg total) by mouth daily. Repeat dose in 3 days 04/19/15   Ozella Rocks, MD  fluticasone [facility]) 50 MCG/ACT nasal spray Place 2 sprays into both nostrils daily. 09/02/17   Cathie Hoops, Amy V, PA-C  ipratropium (ATROVENT) 0.06 % nasal spray Place 2 sprays into both nostrils 4 (four) times daily. 09/02/17   Cathie Hoops, Amy V, PA-C  meloxicam (MOBIC) 7.5 MG tablet Take 1 tablet (7.5 mg total) by mouth daily. Take 1-2 tabs daily for 5 days, then daily as needed for pain and swelling 06/03/14   Waylan Rocher O, PA-C  metroNIDAZOLE (FLAGYL) 500 MG tablet Take 1 tablet (500 mg  total) by mouth 2 (two) times daily. 04/15/15   Elson Areas, PA-C  Norgestimate-Ethinyl Estradiol Triphasic (ORTHO TRI-CYCLEN, 28,) 0.18/0.215/0.25 MG-35 MCG tablet Take 1 tablet by mouth daily.    [provider]  potassium chloride SA (K-DUR,KLOR-CON) 20 MEQ tablet Take 1 tablet (20 mEq total) by mouth 2 (two) times daily. 01/18/16   Azalia Bilis, MD    Family History History reviewed. No pertinent family history.  Social History Social History   Tobacco Use  . Smoking status: Never Smoker  . Smokeless tobacco: Never Used  Substance Use Topics  . Alcohol use: No  . Drug use: No     Allergies   Tylenol with codeine #3 [acetaminophen-codeine]   Review of Systems Review of Systems  Reason unable to perform ROS: See HPI as above.     Physical Exam Triage Vital Signs ED Triage Vitals [09/02/17 1559]  Enc Vitals Group     BP (!) 166/95     Pulse Rate 75     Resp 18     Temp 98.9 F (37.2 C)     Temp src      SpO2 100 %     Weight      Height      Head Circumference      Peak Flow      Pain  Score      Pain Loc      Pain Edu?      Excl. in GC?    No data found.  Updated Vital Signs BP (!) 166/95   Pulse 75   Temp 98.9 F (37.2 C)   Resp 18   LMP 08/19/2017   SpO2 100%   Physical Exam  Constitutional: She is oriented to person, place, and time. She appears well-developed and well-nourished. No distress.  HENT:  Head: Normocephalic and atraumatic.  Right Ear: Tympanic membrane, external ear and ear canal normal. Tympanic membrane is not erythematous and not bulging.  Left Ear: Tympanic membrane, external ear and ear canal normal. Tympanic membrane is not erythematous and not bulging.  Nose: Nose normal. Right sinus exhibits no maxillary sinus tenderness and no frontal sinus tenderness. Left sinus exhibits no maxillary sinus tenderness and no frontal sinus tenderness.  Mouth/Throat: Uvula is midline and mucous membranes are normal. Posterior  oropharyngeal erythema present. No tonsillar exudate.  Eyes: Conjunctivae are normal. Pupils are equal, round, and reactive to light.  Neck: Normal range of motion. Neck supple.  Cardiovascular: Normal rate, regular rhythm and normal heart sounds. Exam reveals no gallop and no friction rub.  No murmur heard. Pulmonary/Chest: Effort normal and breath sounds normal. She has no decreased breath sounds. She has no wheezes. She has no rhonchi. She has no rales.  Lymphadenopathy:    She has no cervical adenopathy.  Neurological: She is alert and oriented to person, place, and time.  Skin: Skin is warm and dry.  Psychiatric: She has a normal mood and affect. Her behavior is normal. Judgment normal.     UC Treatments / Results  Labs (all labs ordered are listed, but only abnormal results are displayed) Labs Reviewed  CULTURE, GROUP A STREP [facility])  POCT RAPID STREP A    EKG  EKG Interpretation None       Radiology No results found.  Procedures Procedures (including critical care time)  Medications Ordered in UC Medications - No data to display   Initial Impression / Assessment and Plan / UC Course  I have reviewed the triage vital signs and the nursing notes.  Pertinent labs & imaging results that were available during my care of the patient were reviewed by me and considered in my medical decision making (see chart for details).    Rapid strep negative. Symptomatic treatment as needed. Return precautions given.   Final Clinical Impressions(s) / UC Diagnoses   Final diagnoses:  Viral illness    ED Discharge Orders        Ordered    fluticasone (FLONASE) 50 MCG/ACT nasal spray  Daily     09/02/17 1708    ipratropium (ATROVENT) 0.06 % nasal spray  4 times daily     09/02/17 [address]1708       Yu, Amy V, PA-C 09/02/17 1708

## 2017-09-02 NOTE — Discharge Instructions (Signed)
Rapid strep negative. Symptoms are most likely due to viral illness. Continue cough medicine to help with cough. Start flonase, atrovent nasal spray for nasal congestion/drainage. You can use over the counter nasal saline rinse such as neti pot for nasal congestion. Keep hydrated, your urine should be clear to pale yellow in color. Tylenol/motrin for fever and pain. Monitor for any worsening of symptoms, chest pain, shortness of breath, wheezing, swelling of the throat, follow up for reevaluation.   For sore throat try using a honey-based tea. Use 3 teaspoons of honey with juice squeezed from half lemon. Place shaved pieces of ginger into 1/2-1 cup of water and warm over stove top. Then mix the ingredients and repeat every 4 hours as needed.

## 2017-09-02 NOTE — ED Triage Notes (Signed)
Pt here for URI symptoms since Saturday, sts that she took some OTC meds yesterday and helped a little

## 2017-09-05 LAB — CULTURE, GROUP A STREP (THRC)

## 2017-11-18 ENCOUNTER — Ambulatory Visit (INDEPENDENT_AMBULATORY_CARE_PROVIDER_SITE_OTHER): Payer: 59 | Admitting: Family Medicine

## 2017-11-18 ENCOUNTER — Encounter: Payer: Self-pay | Admitting: Family Medicine

## 2017-11-18 VITALS — BP 120/86 | HR 83 | Temp 99.0°F | Ht 63.5 in | Wt 128.9 lb

## 2017-11-18 DIAGNOSIS — R42 Dizziness and giddiness: Secondary | ICD-10-CM

## 2017-11-18 DIAGNOSIS — I10 Essential (primary) hypertension: Secondary | ICD-10-CM | POA: Diagnosis not present

## 2017-11-18 DIAGNOSIS — Z7689 Persons encountering health services in other specified circumstances: Secondary | ICD-10-CM

## 2017-11-18 LAB — BASIC METABOLIC PANEL
BUN: 15 mg/dL (ref 6–23)
CHLORIDE: 99 meq/L (ref 96–112)
CO2: 30 mEq/L (ref 19–32)
Calcium: 9.3 mg/dL (ref 8.4–10.5)
Creatinine, Ser: 0.83 mg/dL (ref 0.40–1.20)
GFR: 94.5 mL/min (ref >60.00)
GLUCOSE: 78 mg/dL (ref 70–99)
POTASSIUM: 3.9 meq/L (ref 3.5–5.1)
Sodium: 137 mEq/L (ref 135–145)

## 2017-11-18 NOTE — Progress Notes (Signed)
Patient presents to clinic today to establish care.  SUBJECTIVE: PMH: Pt is a 47 yo female with pmh sig for HTN.  Patient was formally seen at [facility] physicians at [facility].  Pt is followed by OB/Gyn, Dr. Aldona Bar.  HTN: -d/x'd 1 year ago.  At the time patient's BP was 170s over 100 -Patient was started on hydrochlorothiazide 12.5 mg daily by her OB/GYN. -Patient states his BP checked weekly at work typically 120s-130s/80s -Patient endorses occasionally feeling "funny" after working out. -Patient endorses drinking 60 ounces of water per day.  Vertigo: -Patient endorses recently being on a boat at a wedding for 5 hours. -After getting off the boat she experienced a few episodes of feeling unbalanced -Patient endorses improvement in her symptoms but states when she is in the shower she feels like the floor is moving. -Patient notices increase in symptoms when she does not get much sleep. -Patient requesting referral to physical therapy for Epley maneuver.  Allergies: Codeine-hives  Past surgical history: Left oophorectomy for cyst  Social history: Patient is married.  Patient has a daughter.  She denies alcohol, tobacco, drug use.   Health Maintenance: PAP --followed by OB/GYN   Past Medical History:  Diagnosis Date  . Hypertension     Past Surgical History:  Procedure Laterality Date  . OOPHORECTOMY      Current Outpatient Medications on File Prior to Visit  Medication Sig Dispense Refill  . ciprofloxacin (CIPRO) 500 MG tablet Take 1 tablet (500 mg total) by mouth 2 (two) times daily. 20 tablet 0  . cyclobenzaprine (FLEXERIL) 10 MG tablet Take 1 tablet (10 mg total) by mouth 2 (two) times daily as needed for muscle spasms. 20 tablet 0  . diphenoxylate-atropine (LOMOTIL) 2.5-0.025 MG tablet Take 1 tablet by mouth 4 (four) times daily as needed for diarrhea or loose stools. 20 tablet 0  . fluconazole (DIFLUCAN) 150 MG tablet Take 1 tablet (150 mg total) by mouth  daily. Repeat dose in 3 days 2 tablet 0  . fluticasone (FLONASE) 50 MCG/ACT nasal spray Place 2 sprays into both nostrils daily. 1 g 0  . ipratropium (ATROVENT) 0.06 % nasal spray Place 2 sprays into both nostrils 4 (four) times daily. 15 mL 0  . meloxicam (MOBIC) 7.5 MG tablet Take 1 tablet (7.5 mg total) by mouth daily. Take 1-2 tabs daily for 5 days, then daily as needed for pain and swelling 30 tablet 0  . metroNIDAZOLE (FLAGYL) 500 MG tablet Take 1 tablet (500 mg total) by mouth 2 (two) times daily. 14 tablet 0  . Norgestimate-Ethinyl Estradiol Triphasic (ORTHO TRI-CYCLEN, 28,) 0.18/0.215/0.25 MG-35 MCG tablet Take 1 tablet by mouth daily.    . potassium chloride SA (K-DUR,KLOR-CON) 20 MEQ tablet Take 1 tablet (20 mEq total) by mouth 2 (two) times daily. 6 tablet 0   No current facility-administered medications on file prior to visit.     Allergies  Allergen Reactions  . Tylenol With Codeine #3 [Acetaminophen-Codeine] Hives    No family history on file.  Social History   Socioeconomic History  . Marital status: Single    Spouse name: Not on file  . Number of children: Not on file  . Years of education: Not on file  . Highest education level: Not on file  Occupational History  . Not on file  Social Needs  . Financial resource strain: Not on file  . Food insecurity:    Worry: Not on file    Inability: Not  on file  . Transportation needs:    Medical: Not on file    Non-medical: Not on file  Tobacco Use  . Smoking status: Never Smoker  . Smokeless tobacco: Never Used  Substance and Sexual Activity  . Alcohol use: No  . Drug use: No  . Sexual activity: Not on file  Lifestyle  . Physical activity:    Days per week: Not on file    Minutes per session: Not on file  . Stress: Not on file  Relationships  . Social connections:    Talks on phone: Not on file    Gets together: Not on file    Attends religious service: Not on file    Active member of club or organization:  Not on file    Attends meetings of clubs or organizations: Not on file    Relationship status: Not on file  . Intimate partner violence:    Fear of current or ex partner: Not on file    Emotionally abused: Not on file    Physically abused: Not on file    Forced sexual activity: Not on file  Other Topics Concern  . Not on file  Social History Narrative  . Not on file    ROS General: Denies fever, chills, night sweats, changes in weight, changes in appetite  +motion sickness HEENT: Denies headaches, ear pain, changes in vision, rhinorrhea, sore throat CV: Denies CP, palpitations, SOB, orthopnea Pulm: Denies SOB, cough, wheezing GI: Denies abdominal pain, nausea, vomiting, diarrhea, constipation GU: Denies dysuria, hematuria, frequency, vaginal discharge Msk: Denies muscle cramps, joint pains Neuro: Denies weakness, numbness, tingling Skin: Denies rashes, bruising Psych: Denies depression, anxiety, hallucinations  BP 120/86   Pulse 83   Temp 99 F (37.2 C) (Oral)   Ht 5' 3.5" (1.613 m)   Wt 128 lb 14.4 oz (58.5 kg)   LMP 11/11/2017   SpO2 100%   BMI 22.48 kg/m   Physical Exam Gen. Pleasant, well developed, well-nourished, in NAD HEENT - [place]/AT, PERRL, no scleral icterus, no nasal drainage, pharynx without erythema or exudate.  TMs normal bilaterally.  No cervical lymphadenopathy. Lungs: no use of accessory muscles, CTAB, no wheezes, rales or rhonchi Cardiovascular: RRR, No r/g/m, no peripheral edema Abdomen: BS present, soft, nontender, nondistended Neuro:  A&Ox3, CN II-XII intact, normal gait Skin:  Warm, dry, intact, no lesions Psych: normal affect, mood appropriate  Recent Results (from the past 2160 hour(s))  POCT rapid strep A [facility])     Status: None   Collection Time: 09/02/17  5:05 PM  Result Value Ref Range   Streptococcus, Group A Screen (Direct) NEGATIVE NEGATIVE  Culture, group A strep (throat)     Status: None   Collection Time: 09/02/17  5:08 PM   Result Value Ref Range   Specimen Description THROAT    Special Requests NONE    Culture      NO GROUP A STREP (S.PYOGENES) ISOLATED Performed at [facility], [address], [place], [place] [address]    Report Status 09/05/2017 FINAL     Assessment/Plan: Essential hypertension  -continue HCTZ 12.5 mg -lifestyle modifications encouraged -continue checking bp regularly.  Consider obtaining bp cuff for home monitoring. - Plan: Basic metabolic panel  Vertigo -Currently asymptomatic -offered eply maneuver, however pt declined as she did not really want to feel dizzy. -given handout -encouraged to increase po intake of water  Encounter to establish care -We reviewed the PMH, PSH, FH, SH, Meds  and Allergies. -We provided refills for any medications we will prescribe as needed. -We addressed current concerns per orders and patient instructions. -We have asked for records for pertinent exams, studies, vaccines and notes from previous providers. -We have advised patient to follow up per instructions below.   F/u prn  Abbe Amsterdam, MD

## 2017-11-18 NOTE — Patient Instructions (Addendum)
DASH Eating Plan DASH stands for "Dietary Approaches to Stop Hypertension." The DASH eating plan is a healthy eating plan that has been shown to reduce high blood pressure (hypertension). It may also reduce your risk for type 2 diabetes, heart disease, and stroke. The DASH eating plan may also help with weight loss. What are tips for following this plan? General guidelines  Avoid eating more than 2,300 mg (milligrams) of salt (sodium) a day. If you have hypertension, you may need to reduce your sodium intake to 1,500 mg a day.  Limit alcohol intake to no more than 1 drink a day for nonpregnant women and 2 drinks a day for men. One drink equals 12 oz of beer, 5 oz of wine, or 1 oz of hard liquor.  Work with your health care provider to maintain a healthy body weight or to lose weight. Ask what an ideal weight is for you.  Get at least 30 minutes of exercise that causes your heart to beat faster (aerobic exercise) most days of the week. Activities may include walking, swimming, or biking.  Work with your health care provider or diet and nutrition specialist (dietitian) to adjust your eating plan to your individual calorie needs. Reading food labels  Check food labels for the amount of sodium per serving. Choose foods with less than 5 percent of the Daily Value of sodium. Generally, foods with less than 300 mg of sodium per serving fit into this eating plan.  To find whole grains, look for the word "whole" as the first word in the ingredient list. Shopping  Buy products labeled as "low-sodium" or "no salt added."  Buy fresh foods. Avoid canned foods and premade or frozen meals. Cooking  Avoid adding salt when cooking. Use salt-free seasonings or herbs instead of table salt or sea salt. Check with your health care provider or pharmacist before using salt substitutes.  Do not fry foods. Cook foods using healthy methods such as baking, boiling, grilling, and broiling instead.  Cook with  heart-healthy oils, such as olive, canola, soybean, or sunflower oil. Meal planning   Eat a balanced diet that includes: ? 5 or more servings of fruits and vegetables each day. At each meal, try to fill half of your plate with fruits and vegetables. ? Up to 6-8 servings of whole grains each day. ? Less than 6 oz of lean meat, poultry, or fish each day. A 3-oz serving of meat is about the same size as a deck of cards. One egg equals 1 oz. ? 2 servings of low-fat dairy each day. ? A serving of nuts, seeds, or beans 5 times each week. ? Heart-healthy fats. Healthy fats called Omega-3 fatty acids are found in foods such as flaxseeds and coldwater fish, like sardines, salmon, and mackerel.  Limit how much you eat of the following: ? Canned or prepackaged foods. ? Food that is high in trans fat, such as fried foods. ? Food that is high in saturated fat, such as fatty meat. ? Sweets, desserts, sugary drinks, and other foods with added sugar. ? Full-fat dairy products.  Do not salt foods before eating.  Try to eat at least 2 vegetarian meals each week.  Eat more home-cooked food and less restaurant, buffet, and fast food.  When eating at a restaurant, ask that your food be prepared with less salt or no salt, if possible. What foods are recommended? The items listed may not be a complete list. Talk with your dietitian about what   dietary choices are best for you. Grains Whole-grain or whole-wheat bread. Whole-grain or whole-wheat pasta. Brown rice. Oatmeal. Quinoa. Bulgur. Whole-grain and low-sodium cereals. Pita bread. Low-fat, low-sodium crackers. Whole-wheat flour tortillas. Vegetables Fresh or frozen vegetables (raw, steamed, roasted, or grilled). Low-sodium or reduced-sodium tomato and vegetable juice. Low-sodium or reduced-sodium tomato sauce and tomato paste. Low-sodium or reduced-sodium canned vegetables. Fruits All fresh, dried, or frozen fruit. Canned fruit in natural juice (without  added sugar). Meat and other protein foods Skinless chicken or turkey. Ground chicken or turkey. Pork with fat trimmed off. Fish and seafood. Egg whites. Dried beans, peas, or lentils. Unsalted nuts, nut butters, and seeds. Unsalted canned beans. Lean cuts of beef with fat trimmed off. Low-sodium, lean deli meat. Dairy Low-fat (1%) or fat-free (skim) milk. Fat-free, low-fat, or reduced-fat cheeses. Nonfat, low-sodium ricotta or cottage cheese. Low-fat or nonfat yogurt. Low-fat, low-sodium cheese. Fats and oils Soft margarine without trans fats. Vegetable oil. Low-fat, reduced-fat, or light mayonnaise and salad dressings (reduced-sodium). Canola, safflower, olive, soybean, and sunflower oils. Avocado. Seasoning and other foods Herbs. Spices. Seasoning mixes without salt. Unsalted popcorn and pretzels. Fat-free sweets. What foods are not recommended? The items listed may not be a complete list. Talk with your dietitian about what dietary choices are best for you. Grains Baked goods made with fat, such as croissants, muffins, or some breads. Dry pasta or rice meal packs. Vegetables Creamed or fried vegetables. Vegetables in a cheese sauce. Regular canned vegetables (not low-sodium or reduced-sodium). Regular canned tomato sauce and paste (not low-sodium or reduced-sodium). Regular tomato and vegetable juice (not low-sodium or reduced-sodium). Pickles. Olives. Fruits Canned fruit in a light or heavy syrup. Fried fruit. Fruit in cream or butter sauce. Meat and other protein foods Fatty cuts of meat. Ribs. Fried meat. Bacon. Sausage. Bologna and other processed lunch meats. Salami. Fatback. Hotdogs. Bratwurst. Salted nuts and seeds. Canned beans with added salt. Canned or smoked fish. Whole eggs or egg yolks. Chicken or turkey with skin. Dairy Whole or 2% milk, cream, and half-and-half. Whole or full-fat cream cheese. Whole-fat or sweetened yogurt. Full-fat cheese. Nondairy creamers. Whipped toppings.  Processed cheese and cheese spreads. Fats and oils Butter. Stick margarine. Lard. Shortening. Ghee. Bacon fat. Tropical oils, such as coconut, palm kernel, or palm oil. Seasoning and other foods Salted popcorn and pretzels. Onion salt, garlic salt, seasoned salt, table salt, and sea salt. Worcestershire sauce. Tartar sauce. Barbecue sauce. Teriyaki sauce. Soy sauce, including reduced-sodium. Steak sauce. Canned and packaged gravies. Fish sauce. Oyster sauce. Cocktail sauce. Horseradish that you find on the shelf. Ketchup. Mustard. Meat flavorings and tenderizers. Bouillon cubes. Hot sauce and Tabasco sauce. Premade or packaged marinades. Premade or packaged taco seasonings. Relishes. Regular salad dressings. Where to find more information:  National Heart, Lung, and Blood Institute: www.nhlbi.nih.gov  American Heart Association: www.heart.org Summary  The DASH eating plan is a healthy eating plan that has been shown to reduce high blood pressure (hypertension). It may also reduce your risk for type 2 diabetes, heart disease, and stroke.  With the DASH eating plan, you should limit salt (sodium) intake to 2,300 mg a day. If you have hypertension, you may need to reduce your sodium intake to 1,500 mg a day.  When on the DASH eating plan, aim to eat more fresh fruits and vegetables, whole grains, lean proteins, low-fat dairy, and heart-healthy fats.  Work with your health care provider or diet and nutrition specialist (dietitian) to adjust your eating plan to your individual   calorie needs. This information is not intended to replace advice given to you by your health care provider. Make sure you discuss any questions you have with your health care provider. Document Released: 06/19/2011 Document Revised: 06/23/2016 Document Reviewed: 06/23/2016 Elsevier Interactive Patient Education  2018 [employer].  Motion Sickness Motion sickness is an unpleasant, temporary feeling of dizziness and nausea  that occurs when a person is traveling in a moving vehicle. The person may also have abdominal pain, sweating, and paleness. This condition can occur while you travel in a boat, a car, or an airplane, or even on an amusement park ride. The symptoms of motion sickness usually get better after the motion or traveling stops, but problems may persist for hours or days. What are the causes? This condition may be caused by overstimulation of a part of the inner ear that is called the semicircular canals. The semicircular canals help you to keep your balance by sending signals to your brain about movement. If these canals are stimulated too much from motion of your body, you can develop motion sickness. This condition can also occur if your brain gets conflicting signals from the various motion sensors in your body. The effects of motion sickness may be increased by stress, dehydration, other illnesses, or drinking too much alcohol. What increases the risk? This condition is more likely to develop in:  Children who are 59-58 years old.  Women, especially pregnant women or women who take birth control pills.  People who get migraine headaches.  People who have inner ear disorders.  People who take certain medicines.  What are the signs or symptoms? Symptoms of this condition include:  Nausea.  Dizziness.  Vomiting.  Sweating.  Pain in the abdomen.  Being unsteady when walking.  Paleness.  How is this diagnosed? This condition is diagnosed based on a physical exam, your medical history, and your description of the symptoms that you have while traveling or moving. How is this treated? For most people, symptoms fade quickly after the motion stops. A health care provider may recommend or prescribe medicine to help prevent motion sickness. In some cases, this may be in the form of a patch that is placed behind the ear. Avoiding certain things or using certain techniques before or during travel  can also help to prevent episodes of motion sickness. Follow these instructions at home:  Take medicines only as directed by your health care provider.  If you use a motion sickness patch, wash your hands after you put the patch on. Touching your hands to your eyes after using the patch can enlarge (dilate) your pupils for 1-2 days and disturb your vision.  Drink enough fluid to keep your urine clear or pale yellow. You need to stay well hydrated. Take small, frequent sips of liquids as needed. How is this prevented?  If possible, avoid situations that cause your motion sickness.  Do not eat large meals before or during travel. When you travel longer distances, eat small, bland meals.  Do not drink alcohol before or during travel.  Sit in an area of the vehicle where the least motion occurs. ? On an airplane, sit near the wing. Lie back in your seat if possible. ? On a boat, sit near the middle. ? When you ride in a car, sit in the front seat, not the backseat.  Breathe slowly and deeply while you ride in a moving vehicle.  Do not read or focus on nearby objects when you are  riding in a moving vehicle.  Try watching the horizon or a distant object while you are in a moving vehicle. This is especially helpful when you travel in a boat. In a car, ride in the front seat and look out the front window.  Do not smoke before or during travel. Avoid areas where people are smoking.  Get some fresh air if possible. For example, open a window when you are riding in a car.  Plan ahead for travel. Talk with your health care provider about whether you should use medicines to help prevent motion sickness. Contact a health care provider if:  Your vomiting or nausea does not go away within 24 hours.  You notice blood in your vomit. The blood could be dark red, or it may look like coffee grounds.  You faint or you have severe dizziness or light-headedness when you stand up. These may be signs of  dehydration.  You have a fever. Get help right away if:  You have severe pain in your abdomen or chest.  You have trouble breathing.  You have a severe headache.  You develop weakness or numbness on one side of your body.  You have trouble speaking. This information is not intended to replace advice given to you by your health care provider. Make sure you discuss any questions you have with your health care provider. Document Released: 06/30/2005 Document Revised: 12/03/2015 Document Reviewed: 06/07/2014 Elsevier Interactive Patient Education  2018 [employer]. Vertigo Vertigo is the feeling that you or your surroundings are moving when they are not. Vertigo can be dangerous if it occurs while you are doing something that could endanger you or others, such as driving. What are the causes? This condition is caused by a disturbance in the signals that are sent by your body's sensory systems to your brain. Different causes of a disturbance can lead to vertigo, including:  Infections, especially in the inner ear.  A bad reaction to a drug, or misuse of alcohol and medicines.  Withdrawal from drugs or alcohol.  Quickly changing positions, as when lying down or rolling over in bed.  Migraine headaches.  Decreased blood flow to the brain.  Decreased blood pressure.  Increased pressure in the brain from a head or neck injury, stroke, infection, tumor, or bleeding.  Central nervous system disorders.  What are the signs or symptoms? Symptoms of this condition usually occur when you move your head or your eyes in different directions. Symptoms may start suddenly, and they usually last for less than a minute. Symptoms may include:  Loss of balance and falling.  Feeling like you are spinning or moving.  Feeling like your surroundings are spinning or moving.  Nausea and vomiting.  Blurred vision or double vision.  Difficulty hearing.  Slurred  speech.  Dizziness.  Involuntary eye movement (nystagmus).  Symptoms can be mild and cause only slight annoyance, or they can be severe and interfere with daily life. Episodes of vertigo may return (recur) over time, and they are often triggered by certain movements. Symptoms may improve over time. How is this diagnosed? This condition may be diagnosed based on medical history and the quality of your nystagmus. Your health care provider may test your eye movements by asking you to quickly change positions to trigger the nystagmus. This may be called the Dix-Hallpike test, head thrust test, or roll test. You may be referred to a health care provider who specializes in ear, nose, and throat (ENT) problems (otolaryngologist) or  a provider who specializes in disorders of the central nervous system (neurologist). You may have additional testing, including:  A physical exam.  Blood tests.  MRI.  A CT scan.  An electrocardiogram (ECG). This records electrical activity in your heart.  An electroencephalogram (EEG). This records electrical activity in your brain.  Hearing tests.  How is this treated? Treatment for this condition depends on the cause and the severity of the symptoms. Treatment options include:  Medicines to treat nausea or vertigo. These are usually used for severe cases. Some medicines that are used to treat other conditions may also reduce or eliminate vertigo symptoms. These include: ? Medicines that control allergies (antihistamines). ? Medicines that control seizures (anticonvulsants). ? Medicines that relieve depression (antidepressants). ? Medicines that relieve anxiety (sedatives).  Head movements to adjust your inner ear back to normal. If your vertigo is caused by an ear problem, your health care provider may recommend certain movements to correct the problem.  Surgery. This is rare.  Follow these instructions at home: Safety  Move slowly.Avoid sudden body or  head movements.  Avoid driving.  Avoid operating heavy machinery.  Avoid doing any tasks that would cause danger to you or others if you would have a vertigo episode during the task.  If you have trouble walking or keeping your balance, try using a cane for stability. If you feel dizzy or unstable, sit down right away.  Return to your normal activities as told by your health care provider. Ask your health care provider what activities are safe for you. General instructions  Take over-the-counter and prescription medicines only as told by your health care provider.  Avoid certain positions or movements as told by your health care provider.  Drink enough fluid to keep your urine clear or pale yellow.  Keep all follow-up visits as told by your health care provider. This is important. Contact a health care provider if:  Your medicines do not relieve your vertigo or they make it worse.  You have a fever.  Your condition gets worse or you develop new symptoms.  Your family or friends notice any behavioral changes.  Your nausea or vomiting gets worse.  You have numbness or a "pins and needles" sensation in part of your body. Get help right away if:  You have difficulty moving or speaking.  You are always dizzy.  You faint.  You develop severe headaches.  You have weakness in your hands, arms, or legs.  You have changes in your hearing or vision.  You develop a stiff neck.  You develop sensitivity to light. This information is not intended to replace advice given to you by your health care provider. Make sure you discuss any questions you have with your health care provider. Document Released: 04/09/2005 Document Revised: 12/12/2015 Document Reviewed: 10/23/2014 Elsevier Interactive Patient Education  [employer].

## 2017-11-26 ENCOUNTER — Ambulatory Visit (INDEPENDENT_AMBULATORY_CARE_PROVIDER_SITE_OTHER): Payer: 59 | Admitting: Family Medicine

## 2017-11-26 ENCOUNTER — Encounter: Payer: Self-pay | Admitting: Family Medicine

## 2017-11-26 VITALS — BP 120/82 | HR 88 | Temp 98.6°F | Ht 63.5 in | Wt 129.0 lb

## 2017-11-26 DIAGNOSIS — R42 Dizziness and giddiness: Secondary | ICD-10-CM | POA: Diagnosis not present

## 2017-11-26 NOTE — Progress Notes (Signed)
  HPI:  Using dictation device. Unfortunately this device frequently misinterprets words/phrases.  Acute visit for Vertigo: -started 3 weeks ago after being on a boat for 5 hours -occ brief spells of feeling like walking on waves and mild nausea triggered by specific movements -no ha, cp, malaise, sob, or other symptoms -she wanted referral for pt for this, but was not having symptoms at last visit -has been having symptoms several times per week to daily since  ROS: See pertinent positives and negatives per HPI.  Past Medical History:  Diagnosis Date  . Chicken pox   . Depression   . Hypertension   . 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 Screen for STD (sexually transmitted disease)     Past Surgical History:  Procedure Laterality Date  . BREAST BIOPSY  1997  . OOPHORECTOMY      Family History  Problem Relation Age of Onset  . Cancer Mother   . Hypertension Mother   . Mental illness Mother   . Kidney disease Father     SOCIAL HX: see hpi   Current Outpatient Medications:  .  hydrochlorothiazide (HYDRODIURIL) 12.5 MG tablet, TK 1 T PO QAM, Disp: , Rfl: 4 .  Norgestimate-Ethinyl Estradiol Triphasic (ORTHO TRI-CYCLEN, 28,) 0.18/0.215/0.25 MG-35 MCG tablet, Take 1 tablet by mouth daily., Disp: , Rfl:   EXAM:  Vitals:   11/26/17 1135  BP: 120/82  Pulse: 88  Temp: 98.6 F (37 C)  SpO2: 98%    Body mass index is 22.49 kg/m.  GENERAL: vitals reviewed and listed above, alert, oriented, appears well hydrated and in no acute distress  HEENT: atraumatic, conjunttiva clear, no obvious abnormalities on inspection of external nose and ears  NECK: no obvious masses on inspection  LUNGS: clear to auscultation bilaterally, no wheezes, rales or rhonchi, good air movement  CV: HRRR, no peripheral edema  MS: moves all extremities without noticeable abnormality  PSYCH: pleasant and cooperative, no obvious depression or anxiety, speech and thought processing grossly intact, CN II-XII  grossly intact, finger to nose normal, dix hallpike neg  ASSESSMENT AND PLAN:  Discussed the following assessment and plan:  Vertigo - Plan: Ambulatory referral to Physical Therapy  -we discussed possible serious and likely etiologies, workup and treatment, treatment risks and return precautions -after this discussion, Maxx opted for vestibular rehab -follow up advised in 2-4 weeks, sooner if worsening or other symptoms -of course, we advised Ilea  to return or notify a doctor immediately if symptoms worsen or persist or new concerns arise.   Patient Instructions  BEFORE YOU LEAVE: -follow up: with PCP in 2-4 weeks  -We placed a referral for you as discussed. It usually takes about 1-2 weeks to process and schedule this referral. If you have not heard from [place] regarding this appointment in 2 weeks please contact our office.  -Do not drive with vertigo  I hope you are feeling better soon! Seek care promptly if your symptoms worsen, new concerns arise or you are not improving with treatment.      Terressa Koyanagi, DO

## 2017-11-26 NOTE — Patient Instructions (Signed)
BEFORE YOU LEAVE: -follow up: with PCP in 2-4 weeks  -We placed a referral for you as discussed. It usually takes about 1-2 weeks to process and schedule this referral. If you have not heard from [place] regarding this appointment in 2 weeks please contact our office.  -Do not drive with vertigo  I hope you are feeling better soon! Seek care promptly if your symptoms worsen, new concerns arise or you are not improving with treatment.

## 2017-12-16 ENCOUNTER — Ambulatory Visit: Payer: 59 | Attending: Family Medicine | Admitting: Physical Therapy

## 2017-12-16 ENCOUNTER — Other Ambulatory Visit: Payer: Self-pay

## 2017-12-16 ENCOUNTER — Encounter: Payer: Self-pay | Admitting: Physical Therapy

## 2017-12-16 DIAGNOSIS — R29818 Other symptoms and signs involving the nervous system: Secondary | ICD-10-CM | POA: Diagnosis present

## 2017-12-16 DIAGNOSIS — R2689 Other abnormalities of gait and mobility: Secondary | ICD-10-CM | POA: Insufficient documentation

## 2017-12-16 DIAGNOSIS — R42 Dizziness and giddiness: Secondary | ICD-10-CM | POA: Insufficient documentation

## 2017-12-16 NOTE — Patient Instructions (Signed)
Gaze Stabilization: Tip Card  1.Target must remain in focus, not blurry, and appear stationary while head is in motion. 2.Perform exercises with small head movements (45 to either side of midline). 3.Increase speed of head motion so long as target is in focus. 4.If you wear eyeglasses, be sure you can see target through lens (therapist will give specific instructions for bifocal / progressive lenses). 5.These exercises may provoke dizziness or nausea. Work through these symptoms. If too dizzy, slow head movement slightly. Rest between each exercise. 6.Exercises demand concentration; avoid distractions.  Copyright  VHI. All rights reserved.     Special Instructions: Exercises may bring on mild to moderate symptoms of nausea that resolve within 30 minutes of completing exercises. If symptoms are lasting longer than 30 minutes, modify your exercises by:  >decreasing the # of times you complete each activity >ensuring your symptoms return to baseline before moving onto the next exercise >dividing up exercises so you do not do them all in one session, but multiple short sessions throughout the day >doing them once a day until symptoms improve     Gaze Stabilization - Tip Card  For safety, perform standing exercises close to a counter, wall, corner, or next to someone.   Gaze Stabilization - Standing Feet Apart   Feet shoulder width apart, keeping eyes on target on wall 3 feet away, Keeping eyes on target on wall 3 feet away, and move head side to side for _30-60___ seconds rest, repeat.  Repeat while moving head up and down for __30-60__ seconds x 2 reps (if up and down doesn't bother you do not have to continue up and down). Do __2-3__ sessions per day.Do 2-3 sessions per day.   Copyright  VHI. All rights reserved.

## 2017-12-16 NOTE — Therapy (Signed)
[facility] [address] [place], [place], [address] Phone: [phone]   Fax:  [phone]  Physical Therapy Evaluation  Patient Details  Name: Allison Hoffman MRN: [id number] Date of Birth: 22-Nov-1970 Referring Provider: Terressa Koyanagi   Encounter Date: 12/16/2017  PT End of Session - 12/16/17 1753    Visit Number  1    Number of Visits  5    Date for PT Re-Evaluation  01/22/18 date includes one week delay between eval and first treatment due to high census    Authorization Type  [employer] VL for PT & OT    PT Start Time  0807    PT Stop Time  0849    PT Time Calculation (min)  42 min    Activity Tolerance  Patient tolerated treatment well    Behavior During Therapy  [facility] for tasks assessed/performed       Past Medical History:  Diagnosis Date  . Chicken pox   . Depression   . Hypertension   . 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 Screen for STD (sexually transmitted disease)     Past Surgical History:  Procedure Laterality Date  . BREAST BIOPSY  1997  . OOPHORECTOMY      There were no vitals filed for this visit.   Subjective Assessment - 12/16/17 0808    Subjective  Was at a wedding in [place] and on a boat for 5 hours. When got off boat felt strange but dismissed it as fatigue and perhaps her HTN. Got much worse as back at hotel. Did OK flying home. Feels better when riding in a car. Has improved a great deal, however had episode this past Saturday. Has noticed it is really bad when she runs on a treadmill and worst when lack of sleep. Iinitially bad when in the shower; not necessarily tirggered by closing her eyes. Has read alot on the internet re: Mal de Debarquement syndrome and BPPV and wonders if either of these explain her symptoms.     Pertinent History  depression, HTN    Diagnostic tests  none; saw PCP twice and referred to vestibular rehab    Patient Stated Goals  stop having these spells; wants to know if she should ever  go on a cruise    Currently in Pain?  No/denies         [facility] PT Assessment - 12/16/17 1348      Assessment   Medical Diagnosis  Vertigo    Referring Provider  Kriste Basque R    Onset Date/Surgical Date  11/07/17 approximately per EMR    Prior Therapy  none      Precautions   Precautions  None      Balance Screen   Has the patient fallen in the past 6 months  No      Prior Function   Level of Independence  Independent      Observation/Other Assessments   Observations  as PT moves around room or demonstrates rocking sensation she begins to feel "off"    Focus on Therapeutic Outcomes (FOTO)   not captured      Balance   Balance Assessed  Yes      Static Standing Balance   Static Standing Balance -  Activities   Single Leg Stance - Right Leg;Single Leg Stance - Left Leg bil LE's >20 sec (stopped by PT); feet together EC 30 sec      Dynamic Standing Balance  Dynamic Standing - Comments  tandem walk  fwd/bkwd x 6 feet no LOB           Vestibular Assessment - 12/16/17 0819      Vestibular Assessment   General Observation  when begins to feel symptoms during testing, she closes her eyes and holds her breath for short period      Symptom Behavior   Type of Dizziness  Imbalance slight up/down    Frequency of Dizziness  -- a couple days per week    Duration of Dizziness  now down to couple minutes    Aggravating Factors  No known aggravating factors    Relieving Factors  -- sleeping well;       Occulomotor Exam   Spontaneous  Absent    Gaze-induced  Absent    Smooth Pursuits  Intact    Saccades  Comment;Intact 2 hops WNL      Vestibulo-Occular Reflex   VOR Cancellation  Normal caused nausea    Comment  HIT +to Rt      Auditory   Comments  every once in awhile heard a fluttering in ear(s)      Other Tests   Tragal  normal      Positional Testing   Sidelying Test  Sidelying Right;Sidelying Left      Sidelying Right   Sidelying Right Duration  0     Sidelying Right Symptoms  No nystagmus      Sidelying Left   Sidelying Left Duration  0    Sidelying Left Symptoms  No nystagmus      Cognition   Cognition Orientation Level  Appropriate for developmental age          Objective measurements completed on examination: See above findings.       Vestibular Treatment/Exercise - 12/16/17 0001      Vestibular Treatment/Exercise   Vestibular Treatment Provided  Gaze    Gaze Exercises  X1 Viewing Horizontal      X1 Viewing Horizontal   Foot Position  standing feet apart    Time  -- 25 sec    Reps  2    Comments  eyes become dysconjugate with diplopia; unable to merge images once head stopped moving, eyes fixated on target x 20 sec without resolution; closed eyes x 10 sec and then diplopia resolved            PT Education - 12/16/17 1655    Education Details  results of evaluation (characteristic of Mal de Debarquement; not BPPV; no central signs; balance generally WNL when pt asymptomatic); what is known & not known about Mal de Debarquement; use of visual fixation if symptomatic and appropriate targets to use--try to work through episodes with eyes open for up to 30 seconds, then close eyes to "reset"/correct if unable with EO; initiated HEP    Person(s) Educated  Patient    Methods  Explanation;Demonstration;Verbal cues;Handout    Comprehension  Returned demonstration;Verbalized understanding;Verbal cues required;Need further instruction          PT Long Term Goals - 12/16/17 1827      PT LONG TERM GOAL #1   Title  Patient will be able to verbalize appropriate compensatory techniques to utilize if severe symptoms return/persist  (Target LTGs 01/22/18--date is 4 weeks from first treatment visit)    Time  4    Period  Weeks    Status  New      PT LONG TERM GOAL #2  Title  Patient will tolerate jogging on treadmill for >= 10 minutes to allow return to her prior exercise program.     Time  4    Period  Weeks     Status  New      PT LONG TERM GOAL #3   Title  Patient will be able to verbalize steps that work for her to control/reduce her anxiety when symptoms occur.    Time  4    Period  Weeks    Status  New      PT LONG TERM GOAL #4   Title  If symptoms do not improve with PT, pt will be given information on program developed at [facility], [place] to allow her to contact them and see if there are other locations performing the treatment protocol they use that are closer to [place].     Time  4    Period  Weeks    Status  New             Plan - 12/16/17 1758    Clinical Impression Statement  Patient referred to OPPT with diagnosis of vertigo that began after she spent 5 hours on a boat while in [place] (approximately 7 weeks prior to this evaluation). The description and timing of her vertigo and prolonged recovery are characteristic of Mal de Debarquement, which patient had researched and asked if she had. (She was informed I cannot diagnose, but would complete full evaluation to rule out other causes and then treat the symptoms she has as indicated). Her symptoms have slowly diminished, however she continues to have episodes of feeling the floor is "bobbing" as she walks, becomes very off balance/nauseated when attempted jogging on a treadmill, and has feeling of rocking when in the shower. No signs of central vertigo (and her symptoms are not constant, but episodic lasting hours (initially) and minutes). Testing for BPPV was negative. She became symptomatic after head impulse test and when educated in VOR x1 exercise with her eyes clearly becoming dysconjugate and she experienced diplopia for 20+seconds after stopping the exercise. Patient provided HEP and education to help minimize the symptoms when they occur. Prior to becoming symptomatic, her balance testing was WNL and after her symptoms began she showed minor increased sway with static standing feet together EO and EC. Anticipate  patient can benefit from PT to minimize her symptoms and/or to educate her in compensatory techniques and when appropriate to use these. PT will address the deficits mentioned and the additional deficits listed below via the interventions listed below.     History and Personal Factors relevant to plan of care:  PMH-depression, HTN  Personal factors-patient's age and sex put her at higher risk for this type of vertigo; she admits to being highly anxious (which can magnify/prolong her symptoms)    Clinical Presentation  Unstable    Clinical Presentation due to:  7 weeks post-onset continues to experience symptoms that are unpredictably triggered    Clinical Decision Making  High    Rehab Potential  Fair research indicates can be prolonged course of recovery (months to years)    Clinical Impairments Affecting Rehab Potential  ability to control her anxiety, which may heighten symptoms and/or require increased time for her to work through an "'episode"    PT Frequency  1x / week    PT Duration  4 weeks    PT Treatment/Interventions  ADLs/Self Care Home Management;Biofeedback;Gait training;Stair training;Functional mobility training;Therapeutic activities;Therapeutic  exercise;Balance training;Neuromuscular re-education;Patient/family education;Vestibular;Visual/perceptual remediation/compensation    PT Next Visit Plan  See how she is tolerating VORx1 exercise and how frequently she has done it (sadly did not have time to teach her how to advance and now see it will be >2 weeks before return appt); advance VOR exercises and ?try treadmill or trampoline while fixating gaze?; is she remembering to utilize visual fixation when having symptoms and needs to walk/move (or no longer needs this?) head turns with gait while doing cognitive task and other higher level gait activities    Consulted and Agree with Plan of Care  Patient       Patient will benefit from skilled therapeutic intervention in order to improve  the following deficits and impairments:  Decreased activity tolerance, Decreased balance, Dizziness, Difficulty walking, Impaired vision/preception, Other (comment)(some present all the time and some only when symptoms strike; impaired vestibular system)  Visit Diagnosis: Dizziness and giddiness - Plan: PT plan of care cert/re-cert  Other abnormalities of gait and mobility - Plan: PT plan of care cert/re-cert  Other symptoms and signs involving the nervous system - Plan: PT plan of care cert/re-cert     Problem List There are no active problems to display for this patient.   Zena Amos, PT 12/16/2017, 6:46 PM  [facility] [address] [place], [place], [address] Phone: [phone]   Fax:  [phone]  Name: Allison Hoffman MRN: [id number] Date of Birth: [birth year]

## 2018-01-01 ENCOUNTER — Encounter: Payer: 59 | Admitting: Physical Therapy

## 2018-01-05 ENCOUNTER — Ambulatory Visit: Payer: 59 | Admitting: Family Medicine

## 2018-01-06 ENCOUNTER — Encounter: Payer: Self-pay | Admitting: Physical Therapy

## 2018-01-06 ENCOUNTER — Ambulatory Visit: Payer: 59 | Admitting: Physical Therapy

## 2018-01-06 DIAGNOSIS — R42 Dizziness and giddiness: Secondary | ICD-10-CM | POA: Diagnosis not present

## 2018-01-06 DIAGNOSIS — R29818 Other symptoms and signs involving the nervous system: Secondary | ICD-10-CM

## 2018-01-06 NOTE — Therapy (Signed)
[facility] [address] [place], [place], [address] Phone: [phone]   Fax:  [phone]  Physical Therapy Treatment  Patient Details  Name: Allison Hoffman MRN: [id number] Date of Birth: 1970-12-22 Referring Provider: Lucretia Kern   Encounter Date: 01/06/2018  PT End of Session - 01/06/18 1214    Visit Number  2    Number of Visits  5    Date for PT Re-Evaluation  01/22/18 date includes one week delay between eval and first treatment due to high census    Authorization Type  [employer] VL for PT & OT    PT Start Time  0806    PT Stop Time  0846    PT Time Calculation (min)  40 min    Activity Tolerance  Patient tolerated treatment well    Behavior During Therapy  [facility] for tasks assessed/performed       Past Medical History:  Diagnosis Date  . Chicken pox   . Depression   . Hypertension   . 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 Screen for STD (sexually transmitted disease)     Past Surgical History:  Procedure Laterality Date  . BREAST BIOPSY  1997  . OOPHORECTOMY      There were no vitals filed for this visit.  Subjective Assessment - 01/06/18 0808    Subjective  Was doing the exercises (actually only horizontal VORx1) 3 times per day for about a week, but was feeling worse during the day so reduced to 2 times per day and has done better. Did vertical VORx1 (30 seconds x 3 reps) ONE time and had no symptoms however next day her "boat feeling of up/down" was back--not as severe as originally. She hasn't done the vertical since. One day was standing still at her desk (maybe last week) and suddenly felt like the world rotated quickly to the left and then quickly stopped. Has only happened that one time. When she started PT her overall symptoms were probably about a 6 out of 10; now about a 4/10. About 2 weeks ago was able to run on the treadmill and didn't have symptoms. Felt a smidge when in the shower last night. Has definitely  noticed worsening of symptoms when she does not get enough sleep and when stress level rises (currently work very stressful, working 50 hour weeks)    Pertinent History  depression, HTN    Diagnostic tests  none; saw PCP twice and referred to vestibular rehab    Patient Stated Goals  stop having these spells; wants to know if she should ever go on a cruise    Currently in Pain?  No/denies        Treatment- (see also education section)  Patient able to verbalize several instances of increased symptoms, however often her symptoms worsened the day after her incr activity. Increased time spent educating on rationale for habituation, how to choose an appropriate task to use for this type "exercise," and how to grade the difficulty of the exercise/task she is using.   Significantly reduced the way she is doing her VORx1 vertical exercise (see pt instructions) and how to progress.                          PT Education - 01/06/18 1207    Education Details  updated HEP (including how to drastically reduce intensity/amount of vertical VORx1 exercise and how to slowly progress exercise); educated  on process of habituation and why she needs to work through/practice the tasks that bring on her symptoms; variety of options to address her stress levels recommended (EAP she has for free, meditation apps, yoga, general exercise)    Person(s) Educated  Patient    Methods  Explanation;Handout    Comprehension  Verbalized understanding          PT Long Term Goals - 01/06/18 [place] #1   Title  Patient will be able to verbalize appropriate compensatory techniques to utilize if severe symptoms return/persist  (Target LTGs 01/22/18--date is 4 weeks from first treatment visit)    Baseline  6/26 met (pt has been using techniques since educated on eval)    Time  4    Period  Weeks    Status  Achieved      PT LONG TERM GOAL #2   Title  Patient will tolerate jogging  on treadmill for >= 10 minutes to allow return to her prior exercise program.     Baseline  6/26 she reports she has done this at the gym    Time  4    Period  Weeks    Status  Achieved      PT LONG TERM GOAL #3   Title  Patient will be able to verbalize steps that work for her to control/reduce her anxiety when symptoms occur.    Time  4    Period  Weeks    Status  On-going      PT LONG TERM GOAL #4   Title  If symptoms do not improve with PT, pt will be given information on program developed at [facility] to allow her to contact them and see if there are other locations performing the treatment protocol they use that are closer to [place].     Time  4    Period  Weeks    Status  New            Plan - 01/06/18 1215    Clinical Impression Statement  Patient has continued to overall have a reduction in her symptoms, however has had episodes where her symptoms worsened for part of the day. Educated on purpose of habituation exercises/activities and how to ""find the right dose" to help her improve without making her worse for long periods. Focused on progressing her HEP and addressing additional triggers she has identified (especially coping with stress). Will plan to push out her next appointment by 2-3 weeks to give her time to work through her HEP. Discussed that if she is progressing well and does not have further questions (regarding how to progress her exercises) she may choose to cancel her next/last appointment.     Rehab Potential  Fair research indicates can be prolonged course of recovery (months to years)    Clinical Impairments Affecting Rehab Potential  ability to control her anxiety, which may heighten symptoms and/or require increased time for her to work through an "'episode"    PT Frequency  1x / week    PT Duration  4 weeks    PT Treatment/Interventions  ADLs/Self Care Home Management;Biofeedback;Gait training;Stair training;Functional mobility  training;Therapeutic activities;Therapeutic exercise;Balance training;Neuromuscular re-education;Patient/family education;Vestibular;Visual/perceptual remediation/compensation    PT Next Visit Plan  See how she is tolerating VORx1 vertical exercise and how frequently she has done it; advance VOR exercises (standing narrow BOS, busy background, etc etc); head turns with gait while  doing cognitive task and other higher level gait activities; d/c or plan to extend/recert    Consulted and Agree with Plan of Care  Patient       Patient will benefit from skilled therapeutic intervention in order to improve the following deficits and impairments:  Decreased activity tolerance, Decreased balance, Dizziness, Difficulty walking, Impaired vision/preception, Other (comment)(some present all the time and some only when symptoms strike; impaired vestibular system)  Visit Diagnosis: Dizziness and giddiness  Other symptoms and signs involving the nervous system     Problem List There are no active problems to display for this patient.   Rexanne Mano, PT 01/06/2018, 12:26 PM  [facility] [address] [place], [place], [address] Phone: [phone]   Fax:  [phone]  Name: Allison Hoffman MRN: [id number] Date of Birth: 1971/06/23

## 2018-01-06 NOTE — Patient Instructions (Signed)
Start with 10 slow vertical head nods with eyes focused on target once per day.  If next day feel ok, then up to 10 head nods TWICE per day.  If tolerating twice per day 10 head nods, then begin to increase the speed.   The ultimate goal is tolerating the original exercise (30 seconds head nods at quick speed).      Try scrolling computer screen quickly or watching end credits of movie to see if causes symptoms. If so, do these activities in small doses.

## 2018-01-11 ENCOUNTER — Ambulatory Visit: Payer: 59 | Admitting: Family Medicine

## 2018-01-11 ENCOUNTER — Encounter: Payer: 59 | Admitting: Rehabilitative and Restorative Service Providers"

## 2018-01-18 ENCOUNTER — Ambulatory Visit (INDEPENDENT_AMBULATORY_CARE_PROVIDER_SITE_OTHER): Payer: 59 | Admitting: Family Medicine

## 2018-01-18 ENCOUNTER — Encounter: Payer: Self-pay | Admitting: Family Medicine

## 2018-01-18 ENCOUNTER — Encounter: Payer: 59 | Admitting: Physical Therapy

## 2018-01-18 VITALS — BP 138/92 | HR 82 | Temp 98.2°F | Wt 130.0 lb

## 2018-01-18 DIAGNOSIS — R42 Dizziness and giddiness: Secondary | ICD-10-CM | POA: Diagnosis not present

## 2018-01-18 DIAGNOSIS — F439 Reaction to severe stress, unspecified: Secondary | ICD-10-CM | POA: Diagnosis not present

## 2018-01-18 MED ORDER — MECLIZINE HCL 25 MG PO TABS: 25.0000 mg | ORAL_TABLET | Freq: Three times a day (TID) | ORAL | 0 refills | Status: DC | PRN

## 2018-01-18 NOTE — Patient Instructions (Signed)
Vertigo Vertigo means that you feel like you are moving when you are not. Vertigo can also make you feel like things around you are moving when they are not. This feeling can come and go at any time. Vertigo often goes away on its own. Follow these instructions at home:  Avoid making fast movements.  Avoid driving.  Avoid using heavy machinery.  Avoid doing any task or activity that might cause danger to you or other people if you would have a vertigo attack while you are doing it.  Sit down right away if you feel dizzy or have trouble with your balance.  Take over-the-counter and prescription medicines only as told by your doctor.  Follow instructions from your doctor about which positions or movements you should avoid.  Drink enough fluid to keep your pee (urine) clear or pale yellow.  Keep all follow-up visits as told by your doctor. This is important. Contact a doctor if:  Medicine does not help your vertigo.  You have a fever.  Your problems get worse or you have new symptoms.  Your family or friends see changes in your behavior.  You feel sick to your stomach (nauseous) or you throw up (vomit).  You have a "pins and needles" feeling or you are numb in part of your body. Get help right away if:  You have trouble moving or talking.  You are always dizzy.  You pass out (faint).  You get very bad headaches.  You feel weak or have trouble using your hands, arms, or legs.  You have changes in your hearing.  You have changes in your seeing (vision).  You get a stiff neck.  Bright light starts to bother you. This information is not intended to replace advice given to you by your health care provider. Make sure you discuss any questions you have with your health care provider. Document Released: 04/08/2008 Document Revised: 12/06/2015 Document Reviewed: 10/23/2014 Elsevier Interactive Patient Education  2018 [place] and Stress Management Stress is  a normal reaction to life events. It is what you feel when life demands more than you are used to or more than you can handle. Some stress can be useful. For example, the stress reaction can help you catch the last bus of the day, study for a test, or meet a deadline at work. But stress that occurs too often or for too long can cause problems. It can affect your emotional health and interfere with relationships and normal daily activities. Too much stress can weaken your immune system and increase your risk for physical illness. If you already have a medical problem, stress can make it worse. What are the causes? All sorts of life events may cause stress. An event that causes stress for one person may not be stressful for another person. Major life events commonly cause stress. These may be positive or negative. Examples include losing your job, moving into a new home, getting married, having a baby, or losing a loved one. Less obvious life events may also cause stress, especially if they occur day after day or in combination. Examples include working long hours, driving in traffic, caring for children, being in debt, or being in a difficult relationship. What are the signs or symptoms? Stress may cause emotional symptoms including, the following:  Anxiety. This is feeling worried, afraid, on edge, overwhelmed, or out of control.  Anger. This is feeling irritated or impatient.  Depression. This is feeling sad, down, helpless, or  guilty.  Difficulty focusing, remembering, or making decisions.  Stress may cause physical symptoms, including the following:  Aches and pains. These may affect your head, neck, back, stomach, or other areas of your body.  Tight muscles or clenched jaw.  Low energy or trouble sleeping.  Stress may cause unhealthy behaviors, including the following:  Eating to feel better (overeating) or skipping meals.  Sleeping too little, too much, or both.  Working too much or  putting off tasks (procrastination).  Smoking, drinking alcohol, or using drugs to feel better.  How is this diagnosed? Stress is diagnosed through an assessment by your health care provider. Your health care provider will ask questions about your symptoms and any stressful life events.Your health care provider will also ask about your medical history and may order blood tests or other tests. Certain medical conditions and medicine can cause physical symptoms similar to stress. Mental illness can cause emotional symptoms and unhealthy behaviors similar to stress. Your health care provider may refer you to a mental health professional for further evaluation. How is this treated? Stress management is the recommended treatment for stress.The goals of stress management are reducing stressful life events and coping with stress in healthy ways. Techniques for reducing stressful life events include the following:  Stress identification. Self-monitor for stress and identify what causes stress for you. These skills may help you to avoid some stressful events.  Time management. Set your priorities, keep a calendar of events, and learn to say "no." These tools can help you avoid making too many commitments.  Techniques for coping with stress include the following:  Rethinking the problem. Try to think realistically about stressful events rather than ignoring them or overreacting. Try to find the positives in a stressful situation rather than focusing on the negatives.  Exercise. Physical exercise can release both physical and emotional tension. The key is to find a form of exercise you enjoy and do it regularly.  Relaxation techniques. These relax the body and mind. Examples include yoga, meditation, tai chi, biofeedback, deep breathing, progressive muscle relaxation, listening to music, being out in nature, journaling, and other hobbies. Again, the key is to find one or more that you enjoy and can do  regularly.  Healthy lifestyle. Eat a balanced diet, get plenty of sleep, and do not smoke. Avoid using alcohol or drugs to relax.  Strong support network. Spend time with family, friends, or other people you enjoy being around.Express your feelings and talk things over with someone you trust.  Counseling or talktherapy with a mental health professional may be helpful if you are having difficulty managing stress on your own. Medicine is typically not recommended for the treatment of stress.Talk to your health care provider if you think you need medicine for symptoms of stress. Follow these instructions at home:  Keep all follow-up visits as directed by your health care provider.  Take all medicines as directed by your health care provider. Contact a health care provider if:  Your symptoms get worse or you start having new symptoms.  You feel overwhelmed by your problems and can no longer manage them on your own. Get help right away if:  You feel like hurting yourself or someone else. This information is not intended to replace advice given to you by your health care provider. Make sure you discuss any questions you have with your health care provider. Document Released: 12/24/2000 Document Revised: 12/06/2015 Document Reviewed: 02/22/2013 Elsevier Interactive Patient Education  2017 [employer].

## 2018-01-18 NOTE — Progress Notes (Signed)
Subjective:    Patient ID: Allison Hoffman, female    DOB: 02/27/1971, 47 y.o.   MRN: [id number]  No chief complaint on file.   HPI Patient was seen today for acute concern.  Pt endorses ongoing vertigo.  Notes improvement from last OFV but feels it restarted 2/2 not getting enough sleep and increased stress.  Pt feels like the floor is moving underneath her, but denies feeling like the rm is spinning.  Pt has some nausea last wk.  Pt still going to PT for symptoms.    Pt notes increased stress.  Pt states when she tries to go to bed her mind races making it difficult to sleep.  Pt needs 7 hrs of sleep has been getting <4.  Pt endorses working overtime at work, worrying about her kids, and taking on extra projects at [employer].  Pt is considering counseling.  Past Medical History:  Diagnosis Date  . Chicken pox   . Depression   . Hypertension   . 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 Screen for STD (sexually transmitted disease)     Allergies  Allergen Reactions  . Amlodipine     Swelling unable to breathe   . Tylenol With Codeine #3 [Acetaminophen-Codeine] Hives    ROS General: Denies fever, chills, night sweats, changes in weight, changes in appetite  +floor moving, insomnia HEENT: Denies headaches, ear pain, changes in vision, rhinorrhea, sore throat CV: Denies CP, palpitations, SOB, orthopnea Pulm: Denies SOB, cough, wheezing GI: Denies abdominal pain, nausea, vomiting, diarrhea, constipation GU: Denies dysuria, hematuria, frequency, vaginal discharge Msk: Denies muscle cramps, joint pains Neuro: Denies weakness, numbness, tingling Skin: Denies rashes, bruising Psych: Denies depression, anxiety, hallucinations  +increased stress     Objective:    Blood pressure (!) 138/92, pulse 82, temperature 98.2 F (36.8 C), temperature source Oral, weight 130 lb (59 kg), SpO2 99 %.   Gen. Pleasant, well-nourished, in no distress, normal affect   HEENT: [place]/AT, face symmetric, no scleral icterus,  PERRLA, EOMI, nares patent without drainage, pharynx without erythema or exudate. Lungs: no accessory muscle use Cardiovascular: RRR, no peripheral edema Neuro:  A&Ox3, CN II-XII intact, normal gait   Wt Readings from Last 3 Encounters:  01/18/18 130 lb (59 kg)  11/26/17 129 lb (58.5 kg)  11/18/17 128 lb 14.4 oz (58.5 kg)    Lab Results  Component Value Date   WBC 7.8 01/17/2016   HGB 12.7 01/17/2016   HCT 35.7 (L) 01/17/2016   PLT 282 01/17/2016   GLUCOSE 78 11/18/2017   NA 137 11/18/2017   K 3.9 11/18/2017   CL 99 11/18/2017   CREATININE 0.83 11/18/2017   BUN 15 11/18/2017   CO2 30 11/18/2017    Assessment/Plan:  Vertigo  -Continue PT -Patient encouraged to stay hydrated -Rx for Antivert sent to pharmacy PRN.  Patient does not want to take medication but sent in the event she needs something for dizziness. - Plan: meclizine (ANTIVERT) 25 MG tablet  Stress -Patient advised to find ways to decrease stress including yoga -Given handout -Discussed counseling.  Patient will try free resources available through her job. -Advised this does not work she can call and make her own [facility] -Patient advised to take some time next week for her  BP likely elevated to decreased sleep.  We will continue to monitor.  F/u in the next few wks  Abbe AmsterdamShannon Lillie Portner, MD

## 2018-01-29 ENCOUNTER — Ambulatory Visit: Payer: 59 | Attending: Family Medicine | Admitting: Physical Therapy

## 2018-01-29 ENCOUNTER — Encounter: Payer: Self-pay | Admitting: Physical Therapy

## 2018-01-29 DIAGNOSIS — R29818 Other symptoms and signs involving the nervous system: Secondary | ICD-10-CM

## 2018-01-29 NOTE — Therapy (Signed)
[facility] [address] [place], [place], [address] Phone: [phone]   Fax:  [phone]  Physical Therapy Treatment and Discharge Summary  Patient Details  Name: Allison Hoffman MRN: [id number] Date of Birth: 1971/04/19 Referring Provider: Lucretia Kern   Encounter Date: 01/29/2018  PT End of Session - 01/29/18 1542    Visit Number  3    Number of Visits  5    Date for PT Re-Evaluation  01/22/18 date includes one week delay between eval and first treatment due to high census    Authorization Type  [employer] VL for PT & OT    PT Start Time  [phone] late arrival    PT Stop Time  0928    PT Time Calculation (min)  34 min    Activity Tolerance  Patient tolerated treatment well    Behavior During Therapy  [facility] for tasks assessed/performed      Patient returned for first time since 01/06/18, therefore first opportunity to do re-evaluation.   Past Medical History:  Diagnosis Date  . Chicken pox   . Depression   . Hypertension   . 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 Screen for STD (sexually transmitted disease)     Past Surgical History:  Procedure Laterality Date  . BREAST BIOPSY  1997  . OOPHORECTOMY      There were no vitals filed for this visit.  Subjective Assessment - 01/29/18 0855    Subjective  I did what you told me and the first week was rough (I almost called you), but I perservered and I am better now. This whole situation has made me aware how much I have been pushing myself. I really only have symptoms now when I don't get enough sleep or I'm stressed/anxious. She then reports she felt a little funny in the shower this morning. Just comes and goes that fast, doesn't last.     Pertinent History  depression, HTN    Diagnostic tests  none; saw PCP twice and referred to vestibular rehab    Patient Stated Goals  stop having these spells; wants to know if she should ever go on a cruise    Currently in Pain?  No/denies                                PT Education - 01/29/18 1529    Education Details  Discussed whether pt needs to continue doing HEP (she has not done it in several days but then felt a bit off in the shower today). Explained that she probably needs to continue and more slowly taper off the exercises once she is asymptomatic (or since she's already been nearly a week without exercises, just continue without doing them and see if her symptoms start to return. Educated on strategy to "bob" herself in the direction she FEELS she is moving to see if this counteracts the feeling (based on treatment/research done at [facility]). Again educated not to use meclizine unless she is having bad enough symptoms that interfere with her ability to participate (work, home). Educated on how meclizine works and rationale for not using it if possible. Educated not to avoid any situations that trigger symptoms, but may need to expose herself to the stimulus in small doses.     Person(s) Educated  Patient    Methods  Explanation    Comprehension  Verbalized understanding  PT Long Term Goals - 01/29/18 0924      PT LONG TERM GOAL #1   Title  Patient will be able to verbalize appropriate compensatory techniques to utilize if severe symptoms return/persist  (Target LTGs 01/22/18--date is 4 weeks from first treatment visit)    Baseline  6/26 met (pt has been using techniques since educated on eval)    Time  4    Period  Weeks    Status  Achieved      PT LONG TERM GOAL #2   Title  Patient will tolerate jogging on treadmill for >= 10 minutes to allow return to her prior exercise program.     Baseline  6/26 she reports she has done this at the gym    Time  4    Period  Weeks    Status  Achieved      PT LONG TERM GOAL #3   Title  Patient will be able to verbalize steps that work for her to control/reduce her anxiety when symptoms occur.    Baseline  7/19 met; and knows she still has room to  improve her stress management (considering counseling, yoga)    Time  4    Period  Weeks    Status  Achieved      PT LONG TERM GOAL #4   Title  If symptoms do not improve with PT, pt will be given information on program developed at [facility] to allow her to contact them and see if there are other locations performing the treatment protocol they use that are closer to [place].     Baseline  7/19 have twice discussed [facility] treatment program and pt is doing well enough she does not wish to pursue.     Time  --    Period  Weeks    Status  Achieved            Plan - 01/29/18 1543    Clinical Impression Statement  Patient seen for final appointment with focus on education regarding how to "dose" her HEP, additional tips for how to cope if she has another setback, how to know when to reduce exercises and taper to not doing them at all, and other topics (see pt education). Patient agreed to discharge at this time as she met 4 out of 4 goals.     Rehab Potential  [facility] research indicates can be prolonged course of recovery (months to years)    Clinical Impairments Affecting Rehab Potential  ability to control her anxiety, which may heighten symptoms and/or require increased time for her to work through an "'episode"    PT Frequency  1x / week    PT Duration  4 weeks    PT Treatment/Interventions  ADLs/Self Care Home Management;Biofeedback;Gait training;Stair training;Functional mobility training;Therapeutic activities;Therapeutic exercise;Balance training;Neuromuscular re-education;Patient/family education;Vestibular;Visual/perceptual remediation/compensation    PT Next Visit Plan  See how she is tolerating VORx1 vertical exercise and how frequently she has done it; advance VOR exercises (standing narrow BOS, busy background, etc etc); head turns with gait while doing cognitive task and other higher level gait activities; d/c or plan to extend/recert    Consulted and Agree  with Plan of Care  Patient       Patient will benefit from skilled therapeutic intervention in order to improve the following deficits and impairments:  Decreased activity tolerance, Decreased balance, Dizziness, Difficulty walking, Impaired vision/preception, Other (comment)(some present all the time and some only  when symptoms strike; impaired vestibular system)  Visit Diagnosis: Other symptoms and signs involving the nervous system     Problem List There are no active problems to display for this patient.  PHYSICAL THERAPY DISCHARGE SUMMARY  Visits from Start of Care: 3  Current functional level related to goals / functional outcomes: See LTGs above   Remaining deficits: Infrequent episodes lasting only seconds (if that long)   Education / Equipment: HEP and how to continue to progress  Plan: Patient agrees to discharge.  Patient goals were met. Patient is being discharged due to meeting the stated rehab goals.  ?????       Rexanne Mano, PT 01/29/2018, 3:51 PM  [facility] [address] [place], [place], [address] Phone: [phone]   Fax:  [phone]  Name: Allison Hoffman MRN: [id number] Date of Birth: 28-Aug-1970

## 2018-04-05 ENCOUNTER — Telehealth: Payer: Self-pay | Admitting: Family Medicine

## 2018-04-05 DIAGNOSIS — R42 Dizziness and giddiness: Secondary | ICD-10-CM

## 2018-04-05 NOTE — Telephone Encounter (Signed)
Please Advise if ok to place in the referral

## 2018-04-05 NOTE — Telephone Encounter (Signed)
Copied from CRM [id number]. Topic: Referral - Request >> Apr 05, 2018  2:56 PM Allison Hoffman, Allison Hoffman, NT wrote: Reason for CRM: Patient would a referral for M.D.DS  Allison Hoffman/vertigo she is still experiencing it , she says she [place] Allison Hoffman., at [facility] she can be reached on her cell this week at [phone]

## 2018-04-07 NOTE — Telephone Encounter (Signed)
Referral placed.

## 2018-04-07 NOTE — Addendum Note (Signed)
Addended by: Kern Reap B on: 04/07/2018 05:43 PM   Modules accepted: Orders

## 2018-04-07 NOTE — Telephone Encounter (Signed)
Ok to place referral to ENT in [place].

## 2018-04-08 ENCOUNTER — Telehealth: Payer: Self-pay | Admitting: Family Medicine

## 2018-04-08 NOTE — Telephone Encounter (Signed)
FYI

## 2018-04-08 NOTE — Telephone Encounter (Signed)
Copied from CRM [id number]. Topic: Referral - Status >> Apr 08, 2018 11:20 AM Alexander Bergeron B wrote: Reason for CRM: [facility] called to state pt is scheduled for an appt for vertigo and they have also added an audio exam; appt on 10.2.19 any questions contact [facility]

## 2018-06-01 ENCOUNTER — Encounter: Payer: Self-pay | Admitting: Family Medicine

## 2018-06-01 ENCOUNTER — Ambulatory Visit (INDEPENDENT_AMBULATORY_CARE_PROVIDER_SITE_OTHER): Payer: 59 | Admitting: Family Medicine

## 2018-06-01 VITALS — BP 128/82 | HR 74 | Temp 97.9°F | Wt 133.0 lb

## 2018-06-01 DIAGNOSIS — R42 Dizziness and giddiness: Secondary | ICD-10-CM | POA: Diagnosis not present

## 2018-06-01 NOTE — Progress Notes (Signed)
Subjective:    Patient ID: Allison Hoffman, female    DOB: [birth year], 47 y.o.   MRN: [id number]  No chief complaint on file.   HPI Patient was seen today for f/u on dizziness.  Pt states she was seen by [place] in [place].  EMG? testing was normal.  States ENT provider suggested referral to neurology.  Pt endorses continued episodes of dizziness, especially if trying to do sit ups.  Drinking plenty of water daily, not drinking caffiene.  Pt has finished PT.  Pt asks about a "PTSD" diagnosis she saw in her chart on line.    Past Medical History:  Diagnosis Date  . Chicken pox   . Depression   . Hypertension   . 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 Screen for STD (sexually transmitted disease)     Allergies  Allergen Reactions  . Amlodipine     Swelling unable to breathe   . Tylenol With Codeine #3 [Acetaminophen-Codeine] Hives   ROS General: Denies fever, chills, night sweats, changes in weight, changes in appetite HEENT: Denies headaches, ear pain, changes in vision, rhinorrhea, sore throat  +dizziness CV: Denies CP, palpitations, SOB, orthopnea Pulm: Denies SOB, cough, wheezing GI: Denies abdominal pain, nausea, vomiting, diarrhea, constipation GU: Denies dysuria, hematuria, frequency, vaginal discharge Msk: Denies muscle cramps, joint pains Neuro: Denies weakness, numbness, tingling Skin: Denies rashes, bruising Psych: Denies depression, anxiety, hallucinations    Objective:    Blood pressure 128/82, pulse 74, temperature 97.9 F (36.6 C), temperature source Oral, weight 133 lb (60.3 kg), SpO2 98 %.  Gen. Pleasant, well-nourished, in no distress, normal affect  HEENT: [place]/AT, face symmetric, conjunctiva clear, no scleral icterus, PERRLA, EOMI,  nares patent without drainage, pharynx without erythema or exudate. Lungs: no accessory muscle use, CTAB, no wheezes or rales Cardiovascular: RRR, no m/r/g, no peripheral edema Neuro:  A&Ox3, CN II-XII intact, normal gait  Wt Readings from  Last 3 Encounters:  06/01/18 133 lb (60.3 kg)  01/18/18 130 lb (59 kg)  11/26/17 129 lb (58.5 kg)    Lab Results  Component Value Date   WBC 7.8 01/17/2016   HGB 12.7 01/17/2016   HCT 35.7 (L) 01/17/2016   PLT 282 01/17/2016   GLUCOSE 78 11/18/2017   NA 137 11/18/2017   K 3.9 11/18/2017   CL 99 11/18/2017   CREATININE 0.83 11/18/2017   BUN 15 11/18/2017   CO2 30 11/18/2017    Assessment/Plan:  Vertigo  -agree with Neurology consult.  Pt to decide where she would like to be seen. -will place referral if needed. -continue hydration and current treatment  Pt advised this provider did not put a dx of PTSD in the chart.  Pt advised that upon search of pt's chart this provider could not find menton of PTSD.   Fu prn   Abbe AmsterdamShannon Kayveon Lennartz, MD

## 2018-06-03 ENCOUNTER — Encounter: Payer: Self-pay | Admitting: Family Medicine

## 2018-06-21 ENCOUNTER — Encounter: Payer: Self-pay | Admitting: Family Medicine

## 2018-06-21 ENCOUNTER — Ambulatory Visit (INDEPENDENT_AMBULATORY_CARE_PROVIDER_SITE_OTHER): Payer: 59

## 2018-06-21 ENCOUNTER — Ambulatory Visit (INDEPENDENT_AMBULATORY_CARE_PROVIDER_SITE_OTHER): Payer: 59 | Admitting: Family Medicine

## 2018-06-21 VITALS — BP 140/80 | HR 98 | Temp 99.0°F | Resp 16 | Ht 63.5 in | Wt 132.2 lb

## 2018-06-21 DIAGNOSIS — J029 Acute pharyngitis, unspecified: Secondary | ICD-10-CM

## 2018-06-21 DIAGNOSIS — R05 Cough: Secondary | ICD-10-CM

## 2018-06-21 DIAGNOSIS — J069 Acute upper respiratory infection, unspecified: Secondary | ICD-10-CM | POA: Diagnosis not present

## 2018-06-21 DIAGNOSIS — R059 Cough, unspecified: Secondary | ICD-10-CM

## 2018-06-21 MED ORDER — BENZONATATE 100 MG PO CAPS: 200.0000 mg | ORAL_CAPSULE | Freq: Two times a day (BID) | ORAL | 0 refills | Status: AC | PRN

## 2018-06-21 MED ORDER — HYDROCODONE-HOMATROPINE 5-1.5 MG/5ML PO SYRP: 5.0000 mL | ORAL_SOLUTION | Freq: Two times a day (BID) | ORAL | 0 refills | Status: AC | PRN

## 2018-06-21 NOTE — Patient Instructions (Addendum)
  [name] I have seen you today for an acute visit.  A few things to remember from today's visit:   Sore throat - Plan: POCT rapid strep A  Cough - Plan: DG Chest 2 View, HYDROcodone-homatropine (HYCODAN) 5-1.5 MG/5ML syrup, benzonatate (TESSALON) 100 MG capsule  URI, acute   If medications prescribed today, they will not be refill upon request, a follow up appointment with PCP will be necessary to discuss continuation of of treatment if appropriate.   viral infections are self-limited and we treat each symptom depending of severity.  Over the counter medications as decongestants and cold medications usually help, they need to be taken with caution if there is a history of high blood pressure or palpitations.  Because you have high blood pressure I do not recommend decongestants. Tylenol and/or Ibuprofen also helps with most symptoms (headache, muscle aching, fever,etc) Plenty of fluids. Honey helps with cough. Steam inhalations helps with runny nose, nasal congestion, and may prevent sinus infections. Cough and nasal congestion could last a few days and sometimes weeks. Please follow in not any better in 1-2 weeks or if symptoms get worse.   In general please monitor for signs of worsening symptoms and seek immediate medical attention if any concerning.  If symptoms are not resolved in 1-2 weeks you should schedule a follow up appointment with your doctor, before if needed.  I hope you get better soon!

## 2018-06-21 NOTE — Progress Notes (Signed)
ACUTE VISIT  HPI:  Chief Complaint  Patient presents with  . Sore Throat    All started since Thursday  . Cough  . Fatigue  . Chest Pain  . Headache    Allison Hoffman is a 47 y.o.female here today with her husband complaining of 4 days of respiratory symptoms.  Productive cough with clearish sputum initially now she has not noted brownish/yellowish sputum, she denies hemoptysis. Cough is exacerbated by lying down, interfering with sleep. She has not noted wheezing, dyspnea, or stridor.  Subjective fever x1 about 4 days ago. "Little" pressure frontal headache, exacerbated by cough. 2 days ago she had a "bad" sore throat, now it is more like "scratchy throat."  URI   This is a new problem. The current episode started in the past 7 days. The problem has been gradually improving. The fever has been present for less than 1 day. Associated symptoms include chest pain (upon coughing), congestion, coughing, headaches and a sore throat. Pertinent negatives include no abdominal pain, diarrhea, ear pain, nausea, neck pain, plugged ear sensation, rash, vomiting or wheezing. The treatment provided mild relief.     No Hx of recent travel. Her husband was sick recently with same symptoms.  Also her coworkers have been sick. No known insect bite.  Hx of allergies: Denies  OTC medications for this problem: Because history of hypertension, she tried colchicine once.  She also took small amount of Mucinex, which helped.    Review of Systems  Constitutional: Positive for activity change and fatigue. Negative for appetite change and fever.  HENT: Positive for congestion, postnasal drip, sinus pressure and sore throat. Negative for ear pain, mouth sores, trouble swallowing and voice change.   Eyes: Negative for discharge, redness and itching.  Respiratory: Positive for cough. Negative for shortness of breath and wheezing.   Cardiovascular: Positive for chest pain (upon  coughing).  Gastrointestinal: Negative for abdominal pain, diarrhea, nausea and vomiting.  Musculoskeletal: Positive for myalgias. Negative for gait problem and neck pain.  Skin: Negative for rash.  Allergic/Immunologic: Negative for environmental allergies.  Neurological: Positive for light-headedness (today) and headaches. Negative for syncope and weakness.  Hematological: Negative for adenopathy. Does not bruise/bleed easily.  Psychiatric/Behavioral: Negative for confusion. The patient is nervous/anxious.       Current Outpatient Medications on File Prior to Visit  Medication Sig Dispense Refill  . hydrochlorothiazide (HYDRODIURIL) 12.5 MG tablet TK 1 T PO QAM  4  . meclizine (ANTIVERT) 25 MG tablet Take 1 tablet (25 mg total) by mouth 3 (three) times daily as needed for dizziness. 30 tablet 0  . Norgestimate-Ethinyl Estradiol Triphasic (ORTHO TRI-CYCLEN, 28,) 0.18/0.215/0.25 MG-35 MCG tablet Take 1 tablet by mouth daily.     No current facility-administered medications on file prior to visit.      Past Medical History:  Diagnosis Date  . Chicken pox   . Depression   . Hypertension   . 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 Screen for STD (sexually transmitted disease)    Allergies  Allergen Reactions  . Amlodipine     Swelling unable to breathe   . Tylenol With Codeine #3 [Acetaminophen-Codeine] Hives    Social History   Socioeconomic History  . Marital status: Married    Spouse name: Not on file  . Number of children: Not on file  . Years of education: Not on file  . Highest education level: Not on file  Occupational History  .  Not on file  Social Needs  . Financial resource strain: Not on file  . Food insecurity:    Worry: Not on file    Inability: Not on file  . Transportation needs:    Medical: Not on file    Non-medical: Not on file  Tobacco Use  . Smoking status: Never Smoker  . Smokeless tobacco: Never Used  Substance and Sexual Activity  . Alcohol use: No     Frequency: Never  . Drug use: No  . Sexual activity: Yes  Lifestyle  . Physical activity:    Days per week: Not on file    Minutes per session: Not on file  . Stress: Not on file  Relationships  . Social connections:    Talks on phone: Not on file    Gets together: Not on file    Attends religious service: Not on file    Active member of club or organization: Not on file    Attends meetings of clubs or organizations: Not on file    Relationship status: Not on file  Other Topics Concern  . Not on file  Social History Narrative  . Not on file    Vitals:   06/21/18 1559  BP: 140/80  Pulse: 98  Resp: 16  Temp: 99 F (37.2 C)  SpO2: 98%   Body mass index is 23.05 kg/m.   Physical Exam  Nursing note and vitals reviewed. Constitutional: She is oriented to person, place, and time. She appears well-developed. She does not appear ill. No distress.  HENT:  Head: Atraumatic.  Right Ear: Tympanic membrane, external ear and ear canal normal.  Left Ear: Tympanic membrane, external ear and ear canal normal.  Nose: Rhinorrhea present. Right sinus exhibits no maxillary sinus tenderness and no frontal sinus tenderness. Left sinus exhibits no maxillary sinus tenderness and no frontal sinus tenderness.  Mouth/Throat: Oropharynx is clear and moist and mucous membranes are normal.  Postnasal drainage. Hyperemic nasal mucosa.  Eyes: Conjunctivae are normal.  Neck: No muscular tenderness present. No edema and no erythema present.  Cardiovascular: Normal rate and regular rhythm.  No murmur heard. Respiratory: Effort normal and breath sounds normal. No stridor. No respiratory distress. She exhibits no tenderness.  Lymphadenopathy:       Head (right side): No submandibular adenopathy present.       Head (left side): No submandibular adenopathy present.    She has no cervical adenopathy.  Neurological: She is alert and oriented to person, place, and time. She has normal strength.  Skin:  Skin is warm. No rash noted. No erythema.  Psychiatric: Her mood appears anxious.  Well groomed, good eye contact.      ASSESSMENT AND PLAN:   Allison Hoffman was seen today for sore throat, cough, fatigue, chest pain and headache.  Diagnoses and all orders for this visit:  Sore throat Improving. Rapid strep here in the office negative. Because clinical findings do not suggest strep pharyngitis, sample was not sent for culture.  Patient was informed.  -     POCT rapid strep A  URI, acute Symptoms suggests a viral etiology, I explained patient that symptomatic treatment is usually recommended in this case, so I do not think abx is needed at this time. Instructed to monitor for signs of complications,clearly instructed about warning signs.  F/U as needed.   Cough Lung auscultation negative for rales, rhonchi, and wheezing. She is concerned about possible pneumonia and would like to have imaging done today. Some  side effects of hydrocodone discussed.        I also explained that cough and nasal congestion can last a few days and sometimes weeks.  -     DG Chest 2 View; Future -     HYDROcodone-homatropine (HYCODAN) 5-1.5 MG/5ML syrup; Take 5 mLs by mouth every 12 (twelve) hours as needed for up to 10 days for cough. -     benzonatate (TESSALON) 100 MG capsule; Take 2 capsules (200 mg total) by mouth 2 (two) times daily as needed for up to 10 days.     Betty G. [place], MD  [facility]. [place] office.

## 2018-06-22 ENCOUNTER — Other Ambulatory Visit: Payer: Self-pay | Admitting: Family Medicine

## 2018-06-22 MED ORDER — AZITHROMYCIN 250 MG PO TABS: ORAL_TABLET | ORAL | 0 refills | Status: AC

## 2018-07-05 ENCOUNTER — Encounter: Payer: Self-pay | Admitting: Family Medicine

## 2018-07-05 ENCOUNTER — Ambulatory Visit (INDEPENDENT_AMBULATORY_CARE_PROVIDER_SITE_OTHER): Payer: 59 | Admitting: Family Medicine

## 2018-07-05 ENCOUNTER — Ambulatory Visit (INDEPENDENT_AMBULATORY_CARE_PROVIDER_SITE_OTHER): Payer: 59

## 2018-07-05 VITALS — BP 126/84 | HR 91 | Temp 98.2°F | Resp 12 | Ht 63.5 in | Wt 133.2 lb

## 2018-07-05 DIAGNOSIS — R5383 Other fatigue: Secondary | ICD-10-CM | POA: Diagnosis not present

## 2018-07-05 DIAGNOSIS — R9389 Abnormal findings on diagnostic imaging of other specified body structures: Secondary | ICD-10-CM

## 2018-07-05 DIAGNOSIS — R05 Cough: Secondary | ICD-10-CM | POA: Diagnosis not present

## 2018-07-05 DIAGNOSIS — R059 Cough, unspecified: Secondary | ICD-10-CM

## 2018-07-05 MED ORDER — BENZONATATE 100 MG PO CAPS: 200.0000 mg | ORAL_CAPSULE | Freq: Two times a day (BID) | ORAL | 0 refills | Status: AC | PRN

## 2018-07-05 NOTE — Patient Instructions (Signed)
A few things to remember from today's visit:   Other fatigue - Plan: Basic metabolic panel, CBC  Abnormal chest x-ray - Plan: DG Chest 2 View  Cough - Plan: DG Chest 2 View, benzonatate (TESSALON) 100 MG capsule Last chest X ray showed "question minimal RIGHT upper lobe infiltrate" but I do not think it was pneumonia. Congestion, fatigue, and cough are not uncommon after upper respiratory infection and can last a few weeks. Monitor for fever.   Please be sure medication list is accurate. If a new problem present, please set up appointment sooner than planned today.

## 2018-07-05 NOTE — Progress Notes (Signed)
ACUTE VISIT  HPI:  Chief Complaint  Patient presents with  . Follow-up    dx with pneumonia last week, still having cough and feeling weak    Allison Hoffman is a 47 y.o.female here today complaining of persistent respiratory symptoms. I saw her on 06/21/2018, when she was concerned about 4 days history of respiratory symptoms. Because she was concerned about possible pneumonia, chest x-ray was ordered.  CXR 06/21/18:Question minimal RIGHT upper lobe infiltrate. Antibiotic treatment was sent to her pharmacy.  She is concerned because she is still feeling "weak", having cough, and voice changes ("voice sounds funny"). She denies fever but has had "little" chills. This past weekend she started with "scratchy throat", which seems to be improving.  She has not noted exacerbating or alleviating factors. Symptoms do not interfere with her daily activities or with sleep.  She denies abdominal pain, nausea, or vomiting. No history of GERD.  No sick contact after her last visit. She has not noted chest pain, dyspnea, wheezing.  Stated that she felt well while she was taking antibiotic treatment.  Cough  This is a new problem. The current episode started 1 to 4 weeks ago. The problem has been gradually improving. The problem occurs every few minutes. The cough is non-productive. Associated symptoms include chills, nasal congestion, postnasal drip and rhinorrhea. Pertinent negatives include no ear congestion, ear pain, fever, headaches, heartburn, hemoptysis, myalgias, rash, shortness of breath or wheezing. Nothing aggravates the symptoms. The treatment provided mild relief. There is no history of asthma or environmental allergies.    No Hx of recent travel. No sick contact.   Hx of allergies: Denies  OTC medications for this problem: Mucinex.  She is also reporting history of positive TB test, she wonders if this is related with respiratory symptoms she is currently  having.   Review of Systems  Constitutional: Positive for chills and fatigue. Negative for activity change, appetite change and fever.  HENT: Positive for congestion, postnasal drip, rhinorrhea and voice change. Negative for ear pain, mouth sores, sinus pressure and trouble swallowing.   Respiratory: Positive for cough. Negative for hemoptysis, chest tightness, shortness of breath and wheezing.   Gastrointestinal: Negative for abdominal pain, diarrhea, heartburn, nausea and vomiting.  Musculoskeletal: Negative for myalgias and neck pain.  Skin: Negative for rash.  Allergic/Immunologic: Negative for environmental allergies.  Neurological: Negative for weakness and headaches.  Psychiatric/Behavioral: Negative for confusion. The patient is nervous/anxious.       Current Outpatient Medications on File Prior to Visit  Medication Sig Dispense Refill  . hydrochlorothiazide (HYDRODIURIL) 12.5 MG tablet TK 1 T PO QAM  4  . meclizine (ANTIVERT) 25 MG tablet Take 1 tablet (25 mg total) by mouth 3 (three) times daily as needed for dizziness. 30 tablet 0  . Norgestimate-Ethinyl Estradiol Triphasic (ORTHO TRI-CYCLEN, 28,) 0.18/0.215/0.25 MG-35 MCG tablet Take 1 tablet by mouth daily.     No current facility-administered medications on file prior to visit.      Past Medical History:  Diagnosis Date  . Chicken pox   . Depression   . Hypertension   . 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 Screen for STD (sexually transmitted disease)    Allergies  Allergen Reactions  . Amlodipine     Swelling unable to breathe   . Codeine   . Tylenol With Codeine #3 [Acetaminophen-Codeine] Hives    Social History   Socioeconomic History  . Marital status: Married  Spouse name: Not on file  . Number of children: Not on file  . Years of education: Not on file  . Highest education level: Not on file  Occupational History  . Not on file  Social Needs  . Financial resource strain: Not on file  . Food insecurity:     Worry: Not on file    Inability: Not on file  . Transportation needs:    Medical: Not on file    Non-medical: Not on file  Tobacco Use  . Smoking status: Never Smoker  . Smokeless tobacco: Never Used  Substance and Sexual Activity  . Alcohol use: No    Frequency: Never  . Drug use: No  . Sexual activity: Yes  Lifestyle  . Physical activity:    Days per week: Not on file    Minutes per session: Not on file  . Stress: Not on file  Relationships  . Social connections:    Talks on phone: Not on file    Gets together: Not on file    Attends religious service: Not on file    Active member of club or organization: Not on file    Attends meetings of clubs or organizations: Not on file    Relationship status: Not on file  Other Topics Concern  . Not on file  Social History Narrative  . Not on file    Vitals:   07/05/18 1608  BP: 126/84  Pulse: 91  Resp: 12  Temp: 98.2 F (36.8 C)  SpO2: 100%   Body mass index is 23.23 kg/m.    Physical Exam  Nursing note and vitals reviewed. Constitutional: She is oriented to person, place, and time. She appears well-developed and well-nourished. She does not appear ill. No distress.  HENT:  Head: Normocephalic and atraumatic.  Nose: Right sinus exhibits no maxillary sinus tenderness and no frontal sinus tenderness. Left sinus exhibits no maxillary sinus tenderness and no frontal sinus tenderness.  Mouth/Throat: Oropharynx is clear and moist and mucous membranes are normal.  Eyes: Conjunctivae are normal.  Cardiovascular: Normal rate and regular rhythm.  No murmur heard. Respiratory: Effort normal and breath sounds normal. No respiratory distress.  Musculoskeletal:        General: No edema.  Lymphadenopathy:       Head (right side): No submandibular adenopathy present.       Head (left side): No submandibular adenopathy present.    She has no cervical adenopathy.  Neurological: She is alert and oriented to person, place, and  time. She has normal strength. Gait normal.  Skin: Skin is warm. No rash noted. No erythema.  Psychiatric: Her mood appears anxious.  Well groomed, good eye contact.    ASSESSMENT AND PLAN:   Allison Hoffman was seen today for follow-up.  Diagnoses and all orders for this visit:  Other fatigue We discussed possible etiologies. Most likely related to acute illness. Further recommendation will be given according to lab results.  -     Basic metabolic panel -     CBC  Abnormal chest x-ray We discussed chest x-ray report, minimal abnormality.  Explained that findings could be related to viral illness. Further recommendation will be given according to imaging results. -     DG Chest 2 View; Future  Cough Reassured. Explained that cough and congestion can last a few days after acute URI. Lung auscultation is negative today. She would like to have another chest x-ray. Instructed about warning signs.  -  DG Chest 2 View; Future -     benzonatate (TESSALON) 100 MG capsule; Take 2 capsules (200 mg total) by mouth 2 (two) times daily as needed for up to 10 days.        [place] Gustafson G. [place], MD  [facility]. [place] office.

## 2018-07-06 LAB — CBC
HCT: 38.5 % (ref 36.0–46.0)
HEMOGLOBIN: 13 g/dL (ref 12.0–15.0)
MCHC: 33.7 g/dL (ref 30.0–36.0)
MCV: 99.5 fl (ref 78.0–100.0)
PLATELETS: 349 10*3/uL (ref 150.0–400.0)
RBC: 3.87 Mil/uL (ref 3.87–5.11)
RDW: 12.2 % (ref 11.5–15.5)
WBC: 5.7 10*3/uL (ref 4.0–10.5)

## 2018-07-06 LAB — BASIC METABOLIC PANEL
BUN: 11 mg/dL (ref 6–23)
CALCIUM: 9.6 mg/dL (ref 8.4–10.5)
CO2: 28 mEq/L (ref 19–32)
Chloride: 98 mEq/L (ref 96–112)
Creatinine, Ser: 0.84 mg/dL (ref 0.40–1.20)
GFR: 92.96 mL/min (ref >60.00)
Glucose, Bld: 87 mg/dL (ref 70–99)
POTASSIUM: 3.7 meq/L (ref 3.5–5.1)
SODIUM: 135 meq/L (ref 135–145)

## 2018-08-03 ENCOUNTER — Encounter: Payer: Self-pay | Admitting: Family Medicine

## 2018-08-03 ENCOUNTER — Ambulatory Visit (INDEPENDENT_AMBULATORY_CARE_PROVIDER_SITE_OTHER): Payer: 59 | Admitting: Family Medicine

## 2018-08-03 VITALS — BP 136/80 | HR 82 | Temp 98.4°F | Ht 63.5 in | Wt 133.5 lb

## 2018-08-03 DIAGNOSIS — M546 Pain in thoracic spine: Secondary | ICD-10-CM | POA: Diagnosis not present

## 2018-08-03 DIAGNOSIS — R030 Elevated blood-pressure reading, without diagnosis of hypertension: Secondary | ICD-10-CM

## 2018-08-03 MED ORDER — TIZANIDINE HCL 2 MG PO TABS: 2.0000 mg | ORAL_TABLET | Freq: Two times a day (BID) | ORAL | 0 refills | Status: DC | PRN

## 2018-08-03 NOTE — Patient Instructions (Signed)
BEFORE YOU LEAVE: -upper back exercises -follow up: with PCP in 2-3 weeks  Heat, topical menthol, tylenol 500-1000mg  up to 3 times daily as needed for pain.  Can try the muscle relaxer per instructions if needed. Do not drive or operate machinery while taking.  Do the exercises 4 days per week.  Follow up sooner if significant worsening or new concerns.  Tizanidine tablets or capsules What is this medicine? TIZANIDINE (tye ZAN i deen) helps to relieve muscle spasms. It may be used to help in the treatment of multiple sclerosis and spinal cord injury. This medicine may be used for other purposes; ask your health care provider or pharmacist if you have questions. COMMON BRAND NAME(S): Zanaflex What should I tell my health care provider before I take this medicine? They need to know if you have any of these conditions: -kidney disease -liver disease -low blood pressure -mental disorder -an unusual or allergic reaction to tizanidine, other medicines, lactose (tablets only), foods, dyes, or preservatives -pregnant or trying to get pregnant -breast-feeding How should I use this medicine? Take this medicine by mouth with a full glass of water. Take this medicine on an empty stomach, at least 30 minutes before or 2 hours after food. Do not take with food unless you talk with your doctor. Follow the directions on the prescription label. Take your medicine at regular intervals. Do not take your medicine more often than directed. Do not stop taking except on your doctor's advice. Suddenly stopping the medicine can be very dangerous. Talk to your pediatrician regarding the use of this medicine in children. Patients over 3 years old may have a stronger reaction and need a smaller dose. Overdosage: If you think you have taken too much of this medicine contact a poison control center or emergency room at once. NOTE: This medicine is only for you. Do not share this medicine with others. What if I  miss a dose? If you miss a dose, take it as soon as you can. If it is almost time for your next dose, take only that dose. Do not take double or extra doses. What may interact with this medicine? Do not take this medicine with any of the following medications: -ciprofloxacin -fluvoxamine -narcotic medicines for cough -thiabendazole This medicine may also interact with the following medications: -acyclovir -alcohol -antihistamines for allergy, cough, and cold -baclofen -certain medicines for anxiety or sleep -certain medicines for blood pressure, heart disease, irregular heartbeat -certain medicines for depression like amitriptyline, fluoxetine, sertraline -certain medicines for seizures like phenobarbital, primidone -certain medicines for stomach problems like cimetidine, famotidine -female hormones, like estrogens or progestins and birth control pills, patches, rings, or injections -general anesthetics like halothane, isoflurane, methoxyflurane, propofol -local anesthetics like lidocaine, pramoxine, tetracaine -medicines that relax muscles for surgery -narcotic medicines for pain -phenothiazines like chlorpromazine, mesoridazine, prochlorperazine -ticlopidine -zileuton This list may not describe all possible interactions. Give your health care provider a list of all the medicines, herbs, non-prescription drugs, or dietary supplements you use. Also tell them if you smoke, drink alcohol, or use illegal drugs. Some items may interact with your medicine. What should I watch for while using this medicine? Tell your doctor or health care professional if your symptoms do not start to get better or if they get worse. You may get drowsy or dizzy. Do not drive, use machinery, or do anything that needs mental alertness until you know how this medicine affects you. Do not stand or sit up quickly, especially if you  are an older patient. This reduces the risk of dizzy or fainting spells. Alcohol may  interfere with the effect of this medicine. Avoid alcoholic drinks. If you are taking another medicine that also causes drowsiness, you may have more side effects. Give your health care provider a list of all medicines you use. Your doctor will tell you how much medicine to take. Do not take more medicine than directed. Call emergency for help if you have problems breathing or unusual sleepiness. Your mouth may get dry. Chewing sugarless gum or sucking hard candy, and drinking plenty of water may help. Contact your doctor if the problem does not go away or is severe. What side effects may I notice from receiving this medicine? Side effects that you should report to your doctor or health care professional as soon as possible: -allergic reactions like skin rash, itching or hives, swelling of the face, lips, or tongue -breathing problems -hallucinations -signs and symptoms of liver injury like dark yellow or brown urine; general ill feeling or flu-like symptoms; light-colored stools; loss of appetite; nausea; right upper quadrant belly pain; unusually weak or tired; yellowing of the eyes or skin -signs and symptoms of low blood pressure like dizziness; feeling faint or lightheaded, falls; unusually weak or tired -unusually slow heartbeat -unusually weak or tired Side effects that usually do not require medical attention (report to your doctor or health care professional if they continue or are bothersome): -blurred vision -constipation -dizziness -dry mouth -tiredness This list may not describe all possible side effects. Call your doctor for medical advice about side effects. You may report side effects to FDA at 1-800-FDA-1088. Where should I keep my medicine? Keep out of the reach of children. Store at room temperature between 15 and 30 degrees C (59 and 86 degrees F). Throw away any unused medicine after the expiration date. NOTE: This sheet is a summary. It may not cover all possible  information. If you have questions about this medicine, talk to your doctor, pharmacist, or health care provider.  2019 Elsevier/Gold Standard (2017-04-14 13:33:29)

## 2018-08-03 NOTE — Progress Notes (Signed)
HPI:  Using dictation device. Unfortunately this device frequently misinterprets words/phrases.  Acute visit for back pain: -Started 3 days ago after an aggressive bearhug, she was twisting and leaning to the side -The pain came on gradually over the next 3 to 4 hours -Did not have any pain immediately or hear or feel any popping -Has had more some muscle/back soreness moderate to severe in the mid right back; taking NSAIDs which helps some -No radiation, weakness, numbness, popping or clicking  ROS: See pertinent positives and negatives per HPI.  Past Medical History:  Diagnosis Date  . Chicken pox   . Depression   . Hypertension   . 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 Screen for STD (sexually transmitted disease)     Past Surgical History:  Procedure Laterality Date  . BREAST BIOPSY  1997  . OOPHORECTOMY      Family History  Problem Relation Age of Onset  . Cancer Mother   . Hypertension Mother   . Mental illness Mother   . Kidney disease Father     SOCIAL HX: See HPI   Current Outpatient Medications:  .  hydrochlorothiazide (HYDRODIURIL) 12.5 MG tablet, TK 1 T PO QAM, Disp: , Rfl: 4 .  meclizine (ANTIVERT) 25 MG tablet, Take 1 tablet (25 mg total) by mouth 3 (three) times daily as needed for dizziness., Disp: 30 tablet, Rfl: 0 .  Norgestimate-Ethinyl Estradiol Triphasic (ORTHO TRI-CYCLEN, 28,) 0.18/0.215/0.25 MG-35 MCG tablet, Take 1 tablet by mouth daily., Disp: , Rfl:  .  tiZANidine (ZANAFLEX) 2 MG tablet, Take 1 tablet (2 mg total) by mouth 2 (two) times daily as needed for muscle spasms., Disp: 20 tablet, Rfl: 0  EXAM:  Vitals:   08/03/18 1258  BP: 136/80  Pulse: 82  Temp: 98.4 F (36.9 C)    Body mass index is 23.28 kg/m.  GENERAL: vitals reviewed and listed above, alert, oriented, appears well hydrated and in no acute distress  HEENT: atraumatic, conjunttiva clear, no obvious abnormalities on inspection of external nose and ears  NECK: no obvious masses on  inspection  MS: moves all extremities without noticeable abnormality, no bony tenderness to palpation, she does have some muscular tension and soreness in the right lower mid to lower thoracic region, gait normal, movements normal  PSYCH: pleasant and cooperative, no obvious depression or anxiety  ASSESSMENT AND PLAN:  Discussed the following assessment and plan:  Acute right-sided thoracic back pain  Elevated blood pressure reading  -we discussed possible serious and likely etiologies, workup and treatment, treatment risks and return precautions; suspect muscular etiology most likely given gradual onset of the discomfort, other potential etiologies discussed -after this discussion, Allison Hoffman opted for symptomatic care with heat, topical menthol, as needed Tylenol given the blood pressure is a little up today and a muscle relaxer as needed after discussion of risk.  New exercises provided.  Close follow-up for recheck advised.  Advised of ongoing or worsening symptoms she may require imaging. -follow up advised with PCP in 2 to 3 weeks for the back pain and recheck of the blood pressure off NSAIDs -of course, we advised Allison Hoffman  to return or notify a doctor immediately if symptoms worsen or persist or new concerns arise.   Patient Instructions  BEFORE YOU LEAVE: -upper back exercises -follow up: with PCP in 2-3 weeks  Heat, topical menthol, tylenol 500-1000mg  up to 3 times daily as needed for pain.  Can try the muscle relaxer per instructions if needed. Do not drive  or operate machinery while taking.  Do the exercises 4 days per week.  Follow up sooner if significant worsening or new concerns.  Tizanidine tablets or capsules What is this medicine? TIZANIDINE (tye ZAN i deen) helps to relieve muscle spasms. It may be used to help in the treatment of multiple sclerosis and spinal cord injury. This medicine may be used for other purposes; ask your health care provider or pharmacist if  you have questions. COMMON BRAND NAME(S): Zanaflex What should I tell my health care provider before I take this medicine? They need to know if you have any of these conditions: -kidney disease -liver disease -low blood pressure -mental disorder -an unusual or allergic reaction to tizanidine, other medicines, lactose (tablets only), foods, dyes, or preservatives -pregnant or trying to get pregnant -breast-feeding How should I use this medicine? Take this medicine by mouth with a full glass of water. Take this medicine on an empty stomach, at least 30 minutes before or 2 hours after food. Do not take with food unless you talk with your doctor. Follow the directions on the prescription label. Take your medicine at regular intervals. Do not take your medicine more often than directed. Do not stop taking except on your doctor's advice. Suddenly stopping the medicine can be very dangerous. Talk to your pediatrician regarding the use of this medicine in children. Patients over 12 years old may have a stronger reaction and need a smaller dose. Overdosage: If you think you have taken too much of this medicine contact a poison control center or emergency room at once. NOTE: This medicine is only for you. Do not share this medicine with others. What if I miss a dose? If you miss a dose, take it as soon as you can. If it is almost time for your next dose, take only that dose. Do not take double or extra doses. What may interact with this medicine? Do not take this medicine with any of the following medications: -ciprofloxacin -fluvoxamine -narcotic medicines for cough -thiabendazole This medicine may also interact with the following medications: -acyclovir -alcohol -antihistamines for allergy, cough, and cold -baclofen -certain medicines for anxiety or sleep -certain medicines for blood pressure, heart disease, irregular heartbeat -certain medicines for depression like amitriptyline, fluoxetine,  sertraline -certain medicines for seizures like phenobarbital, primidone -certain medicines for stomach problems like cimetidine, famotidine -female hormones, like estrogens or progestins and birth control pills, patches, rings, or injections -general anesthetics like halothane, isoflurane, methoxyflurane, propofol -local anesthetics like lidocaine, pramoxine, tetracaine -medicines that relax muscles for surgery -narcotic medicines for pain -phenothiazines like chlorpromazine, mesoridazine, prochlorperazine -ticlopidine -zileuton This list may not describe all possible interactions. Give your health care provider a list of all the medicines, herbs, non-prescription drugs, or dietary supplements you use. Also tell them if you smoke, drink alcohol, or use illegal drugs. Some items may interact with your medicine. What should I watch for while using this medicine? Tell your doctor or health care professional if your symptoms do not start to get better or if they get worse. You may get drowsy or dizzy. Do not drive, use machinery, or do anything that needs mental alertness until you know how this medicine affects you. Do not stand or sit up quickly, especially if you are an older patient. This reduces the risk of dizzy or fainting spells. Alcohol may interfere with the effect of this medicine. Avoid alcoholic drinks. If you are taking another medicine that also causes drowsiness, you may have more side  effects. Give your health care provider a list of all medicines you use. Your doctor will tell you how much medicine to take. Do not take more medicine than directed. Call emergency for help if you have problems breathing or unusual sleepiness. Your mouth may get dry. Chewing sugarless gum or sucking hard candy, and drinking plenty of water may help. Contact your doctor if the problem does not go away or is severe. What side effects may I notice from receiving this medicine? Side effects that you should  report to your doctor or health care professional as soon as possible: -allergic reactions like skin rash, itching or hives, swelling of the face, lips, or tongue -breathing problems -hallucinations -signs and symptoms of liver injury like dark yellow or brown urine; general ill feeling or flu-like symptoms; light-colored stools; loss of appetite; nausea; right upper quadrant belly pain; unusually weak or tired; yellowing of the eyes or skin -signs and symptoms of low blood pressure like dizziness; feeling faint or lightheaded, falls; unusually weak or tired -unusually slow heartbeat -unusually weak or tired Side effects that usually do not require medical attention (report to your doctor or health care professional if they continue or are bothersome): -blurred vision -constipation -dizziness -dry mouth -tiredness This list may not describe all possible side effects. Call your doctor for medical advice about side effects. You may report side effects to FDA at 1-800-FDA-1088. Where should I keep my medicine? Keep out of the reach of children. Store at room temperature between 15 and 30 degrees C (59 and 86 degrees F). Throw away any unused medicine after the expiration date. NOTE: This sheet is a summary. It may not cover all possible information. If you have questions about this medicine, talk to your doctor, pharmacist, or health care provider.  2019 Elsevier/Gold Standard (2017-04-14 13:33:29)       Allison Koyanagi, DO

## 2018-08-24 ENCOUNTER — Encounter: Payer: Self-pay | Admitting: Family Medicine

## 2018-08-24 ENCOUNTER — Ambulatory Visit (INDEPENDENT_AMBULATORY_CARE_PROVIDER_SITE_OTHER): Payer: 59 | Admitting: Family Medicine

## 2018-08-24 VITALS — BP 112/80 | HR 68 | Temp 97.6°F | Wt 135.0 lb

## 2018-08-24 DIAGNOSIS — Z0130 Encounter for examination of blood pressure without abnormal findings: Secondary | ICD-10-CM | POA: Diagnosis not present

## 2018-08-24 DIAGNOSIS — M545 Low back pain, unspecified: Secondary | ICD-10-CM

## 2018-08-24 NOTE — Progress Notes (Signed)
Subjective:    Patient ID: Allison Hoffman, female    DOB: February 26, 1971, 48 y.o.   MRN: [id number]  No chief complaint on file.   HPI Patient was seen today for f/u on back pain.  Pt seen 08/03/18 acute back pain after being hugged by tightly by a church member.  Pt was twisting and bending to the side as the person was in a wheelchair.  Pt given rx for tizanidine and advised to f/u for continued symptoms.  Pt endorses midline lumbar spine throbbing sensation that comes and goes.  Nothing makes the feeling worse.  Muscle relaxer makes the feeling better.  Denies loss of bowel or bladder, numbness or tingling in LEs, weakness in LEs, SOB.  Pt concerned her back was broken.  Denies hearing any pops, clicks, or feeling any tears.  Pt's bp was elevated at 136/80 during last OFV.  Pt denies HAs, changes in vision, CP.  Thought elevated 2/2/ NSAIDs or pain.  Past Medical History:  Diagnosis Date  . Chicken pox   . Depression   . Hypertension   . 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 Screen for STD (sexually transmitted disease)     Allergies  Allergen Reactions  . Amlodipine     Swelling unable to breathe   . Codeine   . Tylenol With Codeine #3 [Acetaminophen-Codeine] Hives    ROS General: Denies fever, chills, night sweats, changes in weight, changes in appetite HEENT: Denies headaches, ear pain, changes in vision, rhinorrhea, sore throat CV: Denies CP, palpitations, SOB, orthopnea Pulm: Denies SOB, cough, wheezing GI: Denies abdominal pain, nausea, vomiting, diarrhea, constipation GU: Denies dysuria, hematuria, frequency, vaginal discharge Msk: Denies muscle cramps, joint pains  +back pain Neuro: Denies weakness, numbness, tingling Skin: Denies rashes, bruising Psych: Denies depression, anxiety, hallucinations    Objective:    Blood pressure 112/80, pulse 68, temperature 97.6 F (36.4 C), temperature source Oral, weight 135 lb (61.2 kg), SpO2 95 %.  Gen. Pleasant, well-nourished, in no distress,  normal affect   HEENT: [place]/AT, face symmetric, no scleral icterus, PERRLA, nares patent without drainage Musculoskeletal: No TTP of cervical, thoracic, lumbar, or paraspinal muscles.  No pain with flexion or extension of spine.  Pt able to touch toes without difficulty.  No pain with lateral motion of upper body.  No deformities, no cyanosis or clubbing, normal tone Neuro:  A&Ox3, CN II-XII intact, normal gait Skin:  Warm, no lesions/ rash   Wt Readings from Last 3 Encounters:  08/03/18 133 lb 8 oz (60.6 kg)  07/05/18 133 lb 4 oz (60.4 kg)  06/21/18 132 lb 3.2 oz (60 kg)    Lab Results  Component Value Date   WBC 5.7 07/05/2018   HGB 13.0 07/05/2018   HCT 38.5 07/05/2018   PLT 349.0 07/05/2018   GLUCOSE 87 07/05/2018   NA 135 07/05/2018   K 3.7 07/05/2018   CL 98 07/05/2018   CREATININE 0.84 07/05/2018   BUN 11 07/05/2018   CO2 28 07/05/2018    Assessment/Plan:  Lumbar pain  -Likely musculoskeletal injury.  Fracture less likely -Discussed continuing tizanidine 2 mg as needed -Patient encouraged to continue NSAIDs, heat, stretching exercises -Discussed muscle strain symptoms may last 6 to 8 weeks. -For continued symptoms consider imaging  Blood pressure check -normotensive this visit 112/80 -possibly elevated at last OFV 2/2 pain, NSAIDs -will continue to monitor.  Follow-up.  Abbe AmsterdamShannon Daaiel Starlin, MD

## 2018-09-15 ENCOUNTER — Other Ambulatory Visit: Payer: Self-pay | Admitting: Family Medicine

## 2018-09-15 MED ORDER — HYDROCHLOROTHIAZIDE 12.5 MG PO TABS: ORAL_TABLET | ORAL | 1 refills | Status: AC

## 2018-09-15 NOTE — Telephone Encounter (Signed)
Requested medication (s) are due for refill today - yes  Requested medication (s) are on the active medication list -yes  Future visit scheduled - no  Last refill: 11/12/17  Notes to clinic: Patient is requesting a Rx that is historical provider sent for PCP review  Requested Prescriptions  Pending Prescriptions Disp Refills   hydrochlorothiazide (HYDRODIURIL) 12.5 MG tablet  4    Sig: TK 1 T PO QAM     Cardiovascular: Diuretics - Thiazide Passed - 09/15/2018  8:12 AM      Passed - Ca in normal range and within 360 days    Calcium  Date Value Ref Range Status  07/05/2018 9.6 8.4 - 10.5 mg/dL Final         Passed - Cr in normal range and within 360 days    Creatinine, Ser  Date Value Ref Range Status  07/05/2018 0.84 0.40 - 1.20 mg/dL Final         Passed - K in normal range and within 360 days    Potassium  Date Value Ref Range Status  07/05/2018 3.7 3.5 - 5.1 mEq/L Final         Passed - Na in normal range and within 360 days    Sodium  Date Value Ref Range Status  07/05/2018 135 135 - 145 mEq/L Final         Passed - Last BP in normal range    BP Readings from Last 1 Encounters:  08/24/18 112/80         Passed - Valid encounter within last 6 months    Recent Outpatient Visits          3 weeks ago Lumbar pain   [facility], Bettey Mare, MD   1 month ago Acute right-sided thoracic back pain   [facility], [place] R, DO   2 months ago Other fatigue   [facility], Timoteo Expose, MD   2 months ago Sore throat   [facility], Timoteo Expose, MD   3 months ago Vertigo   [facility], Bettey Mare, MD              Requested Prescriptions  Pending Prescriptions Disp Refills   hydrochlorothiazide (HYDRODIURIL) 12.5 MG tablet  4    Sig: TK 1 T PO QAM     Cardiovascular: Diuretics - Thiazide Passed - 09/15/2018  8:12 AM      Passed - Ca in normal  range and within 360 days    Calcium  Date Value Ref Range Status  07/05/2018 9.6 8.4 - 10.5 mg/dL Final         Passed - Cr in normal range and within 360 days    Creatinine, Ser  Date Value Ref Range Status  07/05/2018 0.84 0.40 - 1.20 mg/dL Final         Passed - K in normal range and within 360 days    Potassium  Date Value Ref Range Status  07/05/2018 3.7 3.5 - 5.1 mEq/L Final         Passed - Na in normal range and within 360 days    Sodium  Date Value Ref Range Status  07/05/2018 135 135 - 145 mEq/L Final         Passed - Last BP in normal range    BP Readings from Last 1 Encounters:  08/24/18 112/80  Passed - Valid encounter within last 6 months    Recent Outpatient Visits          3 weeks ago Lumbar pain   [facility], Bettey Mare, MD   1 month ago Acute right-sided thoracic back pain   [facility], [place] R, DO   2 months ago Other fatigue   [facility], Timoteo Expose, MD   2 months ago Sore throat   [facility], Timoteo Expose, MD   3 months ago Vertigo   [facility], Bettey Mare, MD

## 2018-09-15 NOTE — Telephone Encounter (Signed)
Copied from CRM [id number]. Topic: Quick Communication - Rx Refill/Question >> Sep 15, 2018  8:06 AM Percival Spanish wrote: Medication  hydrochlorothiazide (HYDRODIURIL) 12.5 MG tablet  Preferred Pharmacy  [facility] [address]   Agent: Please be advised that RX refills may take up to 3 business days. We ask that you follow-up with your pharmacy.

## 2018-10-06 LAB — HM MAMMOGRAPHY

## 2019-06-15 ENCOUNTER — Other Ambulatory Visit: Payer: Self-pay

## 2019-06-16 ENCOUNTER — Ambulatory Visit (INDEPENDENT_AMBULATORY_CARE_PROVIDER_SITE_OTHER): Payer: 59 | Admitting: Family Medicine

## 2019-06-16 ENCOUNTER — Encounter: Payer: Self-pay | Admitting: Family Medicine

## 2019-06-16 VITALS — BP 128/80 | HR 88 | Temp 97.8°F | Wt 137.0 lb

## 2019-06-16 DIAGNOSIS — R350 Frequency of micturition: Secondary | ICD-10-CM | POA: Diagnosis not present

## 2019-06-16 DIAGNOSIS — I10 Essential (primary) hypertension: Secondary | ICD-10-CM | POA: Diagnosis not present

## 2019-06-16 DIAGNOSIS — Z13220 Encounter for screening for lipoid disorders: Secondary | ICD-10-CM | POA: Diagnosis not present

## 2019-06-16 DIAGNOSIS — F5101 Primary insomnia: Secondary | ICD-10-CM | POA: Diagnosis not present

## 2019-06-16 DIAGNOSIS — Z0000 Encounter for general adult medical examination without abnormal findings: Secondary | ICD-10-CM

## 2019-06-16 NOTE — Progress Notes (Signed)
Subjective:     Allison Hoffman is a 48 y.o. female and is here for a comprehensive physical exam. The patient reports problems - insomnia, and urinary frequency.  Pt endorses difficulty falling asleep.  May get in bed by 10-10:30 p.m. and up by 6 AM.  Patient notes waking up to use the restroom it was much per night-difficulty falling asleep afterwards. Drinking more water and fluids during the day.  Denies dysuria, hematuria, suprapubic pain, n/v, back pain.  After using restroom may stay up for 1 hr plus.  Pt notes mind feels like it is racing when she tries to fall sleep.  Pt endorses some stress.    Pt followed by OB/Gyn.  Pap and mammogram (10/06/2018) up to date.  Social History   Socioeconomic History  . Marital status: Married    Spouse name: Not on file  . Number of children: Not on file  . Years of education: Not on file  . Highest education level: Not on file  Occupational History  . Not on file  Social Needs  . Financial resource strain: Not on file  . Food insecurity    Worry: Not on file    Inability: Not on file  . Transportation needs    Medical: Not on file    Non-medical: Not on file  Tobacco Use  . Smoking status: Never Smoker  . Smokeless tobacco: Never Used  Substance and Sexual Activity  . Alcohol use: No    Frequency: Never  . Drug use: No  . Sexual activity: Yes  Lifestyle  . Physical activity    Days per week: Not on file    Minutes per session: Not on file  . Stress: Not on file  Relationships  . Social Musician on phone: Not on file    Gets together: Not on file    Attends religious service: Not on file    Active member of club or organization: Not on file    Attends meetings of clubs or organizations: Not on file    Relationship status: Not on file  . Intimate partner violence    Fear of current or ex partner: Not on file    Emotionally abused: Not on file    Physically abused: Not on file    Forced sexual activity: Not on file   Other Topics Concern  . Not on file  Social History Narrative  . Not on file   Health Maintenance  Topic Date Due  . HIV Screening  03/26/1985  . TETANUS/TDAP  03/26/1989  . INFLUENZA VACCINE  02/12/2019  . PAP SMEAR-Modifier  09/10/2020    The following portions of the patient's history were reviewed and updated as appropriate: allergies, current medications, past family history, past medical history, past social history, past surgical history and problem list.  Review of Systems Pertinent items noted in HPI and remainder of comprehensive ROS otherwise negative.   Objective:    BP 128/80 (BP Location: Left Arm, Patient Position: Sitting, Cuff Size: Normal)   Pulse 88   Temp 97.8 F (36.6 C) (Temporal)   Wt 137 lb (62.1 kg)   LMP 05/26/2019 (Exact Date)   SpO2 99%   BMI 23.89 kg/m  General appearance: alert, cooperative and no distress Head: Normocephalic, without obvious abnormality, atraumatic Eyes: conjunctivae/corneas clear. PERRL, EOM's intact. Fundi benign. Ears: normal TM's and external ear canals both ears Nose: Nares normal. Septum midline. Mucosa normal. No drainage or sinus tenderness. Throat:  lips, mucosa, and tongue normal; teeth and gums normal Neck: no adenopathy, no carotid bruit, no JVD, supple, symmetrical, trachea midline and thyroid not enlarged, symmetric, no tenderness/mass/nodules Lungs: clear to auscultation bilaterally Heart: regular rate and rhythm, S1, S2 normal, no murmur, click, rub or gallop Abdomen: soft, non-tender; bowel sounds normal; no masses,  no organomegaly Extremities: extremities normal, atraumatic, no cyanosis or edema Pulses: 2+ and symmetric Skin: Skin color, texture, turgor normal. No rashes or lesions Lymph nodes: Cervical, supraclavicular, and axillary nodes normal. Neurologic: Alert and oriented X 3, normal strength and tone. Normal symmetric reflexes. Normal coordination and gait    Assessment:    Healthy female exam  with insomnia and urinary frequency.     Plan:     Anticipatory guidance given including wearing seatbelts, smoke detectors in the home, increasing physical activity, increasing p.o. intake of water and vegetables. -will obtain labs.  Of note: pt not fasting this visit -pap and mammogram up to date.  Followed by OB/Gyn. -given handout -next CPE in 1 yr See After Visit Summary for Counseling Recommendations    Primary insomnia  -discussed sleep hygiene -pt to try mealtonin - Plan: TSH  Essential hypertension  -controlled -continue lifestyle modifications -continue HCTZ 12.5 mg daily - Plan: Basic Metabolic Panel  Screening for cholesterol level  - Plan: Lipid Panel  Urinary frequency  -discussed possible causes.  Likely 2/2 increased po fluid intake.  Will check A1C.  UTI less likely. - Plan: Hemoglobin A1c   F/u in 2-4 wks, sooner if needed  Grier Mitts, MD

## 2019-06-16 NOTE — Patient Instructions (Addendum)
You can try Melatonin 1-3 mg for your difficulty falling asleep.  Preventive Care 80-49 Years Old, Female Preventive care refers to visits with your health care provider and lifestyle choices that can promote health and wellness. This includes:  A yearly physical exam. This may also be called an annual well check.  Regular dental visits and eye exams.  Immunizations.  Screening for certain conditions.  Healthy lifestyle choices, such as eating a healthy diet, getting regular exercise, not using drugs or products that contain nicotine and tobacco, and limiting alcohol use. What can I expect for my preventive care visit? Physical exam Your health care provider will check your:  Height and weight. This may be used to calculate body mass index (BMI), which tells if you are at a healthy weight.  Heart rate and blood pressure.  Skin for abnormal spots. Counseling Your health care provider may ask you questions about your:  Alcohol, tobacco, and drug use.  Emotional well-being.  Home and relationship well-being.  Sexual activity.  Eating habits.  Work and work Statistician.  Method of birth control.  Menstrual cycle.  Pregnancy history. What immunizations do I need?  Influenza (flu) vaccine  This is recommended every year. Tetanus, diphtheria, and pertussis (Tdap) vaccine  You may need a Td booster every 10 years. Varicella (chickenpox) vaccine  You may need this if you have not been vaccinated. Zoster (shingles) vaccine  You may need this after age 45. Measles, mumps, and rubella (MMR) vaccine  You may need at least one dose of MMR if you were born in 1957 or later. You may also need a second dose. Pneumococcal conjugate (PCV13) vaccine  You may need this if you have certain conditions and were not previously vaccinated. Pneumococcal polysaccharide (PPSV23) vaccine  You may need one or two doses if you smoke cigarettes or if you have certain conditions.  Meningococcal conjugate (MenACWY) vaccine  You may need this if you have certain conditions. Hepatitis A vaccine  You may need this if you have certain conditions or if you travel or work in places where you may be exposed to hepatitis A. Hepatitis B vaccine  You may need this if you have certain conditions or if you travel or work in places where you may be exposed to hepatitis B. Haemophilus influenzae type b (Hib) vaccine  You may need this if you have certain conditions. Human papillomavirus (HPV) vaccine  If recommended by your health care provider, you may need three doses over 6 months. You may receive vaccines as individual doses or as more than one vaccine together in one shot (combination vaccines). Talk with your health care provider about the risks and benefits of combination vaccines. What tests do I need? Blood tests  Lipid and cholesterol levels. These may be checked every 5 years, or more frequently if you are over 19 years old.  Hepatitis C test.  Hepatitis B test. Screening  Lung cancer screening. You may have this screening every year starting at age 49 if you have a 30-pack-year history of smoking and currently smoke or have quit within the past 15 years.  Colorectal cancer screening. All adults should have this screening starting at age 2 and continuing until age 73. Your health care provider may recommend screening at age 84 if you are at increased risk. You will have tests every 1-10 years, depending on your results and the type of screening test.  Diabetes screening. This is done by checking your blood sugar (glucose) after  you have not eaten for a while (fasting). You may have this done every 1-3 years.  Mammogram. This may be done every 1-2 years. Talk with your health care provider about when you should start having regular mammograms. This may depend on whether you have a family history of breast cancer.  BRCA-related cancer screening. This may be done  if you have a family history of breast, ovarian, tubal, or peritoneal cancers.  Pelvic exam and Pap test. This may be done every 3 years starting at age 76. Starting at age 62, this may be done every 5 years if you have a Pap test in combination with an HPV test. Other tests  Sexually transmitted disease (STD) testing.  Bone density scan. This is done to screen for osteoporosis. You may have this scan if you are at high risk for osteoporosis. Follow these instructions at home: Eating and drinking  Eat a diet that includes fresh fruits and vegetables, whole grains, lean protein, and low-fat dairy.  Take vitamin and mineral supplements as recommended by your health care provider.  Do not drink alcohol if: ? Your health care provider tells you not to drink. ? You are pregnant, may be pregnant, or are planning to become pregnant.  If you drink alcohol: ? Limit how much you have to 0-1 drink a day. ? Be aware of how much alcohol is in your drink. In the [place] one drink equals one 12 oz bottle of beer (355 mL), one 5 oz glass of wine (148 mL), or one 1 oz glass of hard liquor (44 mL). Lifestyle  Take daily care of your teeth and gums.  Stay active. Exercise for at least 30 minutes on 5 or more days each week.  Do not use any products that contain nicotine or tobacco, such as cigarettes, e-cigarettes, and chewing tobacco. If you need help quitting, ask your health care provider.  If you are sexually active, practice safe sex. Use a condom or other form of birth control (contraception) in order to prevent pregnancy and STIs (sexually transmitted infections).  If told by your health care provider, take low-dose aspirin daily starting at age 54. What's next?  Visit your health care provider once a year for a well check visit.  Ask your health care provider how often you should have your eyes and teeth checked.  Stay up to date on all vaccines. This information is not intended to  replace advice given to you by your health care provider. Make sure you discuss any questions you have with your health care provider. Document Released: 07/27/2015 Document Revised: 03/11/2018 Document Reviewed: 03/11/2018 Elsevier Patient Education  2020 [place] Your Hypertension Hypertension is commonly called high blood pressure. This is when the force of your blood pressing against the walls of your arteries is too strong. Arteries are blood vessels that carry blood from your heart throughout your body. Hypertension forces the heart to work harder to pump blood, and may cause the arteries to become narrow or stiff. Having untreated or uncontrolled hypertension can cause heart attack, stroke, kidney disease, and other problems. What are blood pressure readings? A blood pressure reading consists of a higher number over a lower number. Ideally, your blood pressure should be below 120/80. The first ("top") number is called the systolic pressure. It is a measure of the pressure in your arteries as your heart beats. The second ("bottom") number is called the diastolic pressure. It is a measure of the pressure in  your arteries as the heart relaxes. What does my blood pressure reading mean? Blood pressure is classified into four stages. Based on your blood pressure reading, your health care provider may use the following stages to determine what type of treatment you need, if any. Systolic pressure and diastolic pressure are measured in a unit called mm Hg. Normal  Systolic pressure: below 166.  Diastolic pressure: below 80. Elevated  Systolic pressure: 063-016.  Diastolic pressure: below 80. Hypertension stage 1  Systolic pressure: 010-932.  Diastolic pressure: 35-57. Hypertension stage 2  Systolic pressure: 322 or above.  Diastolic pressure: 90 or above. What health risks are associated with hypertension? Managing your hypertension is an important responsibility.  Uncontrolled hypertension can lead to:  A heart attack.  A stroke.  A weakened blood vessel (aneurysm).  Heart failure.  Kidney damage.  Eye damage.  Metabolic syndrome.  Memory and concentration problems. What changes can I make to manage my hypertension? Hypertension can be managed by making lifestyle changes and possibly by taking medicines. Your health care provider will help you make a plan to bring your blood pressure within a normal range. Eating and drinking   Eat a diet that is high in fiber and potassium, and low in salt (sodium), added sugar, and fat. An example eating plan is called the DASH (Dietary Approaches to Stop Hypertension) diet. To eat this way: ? Eat plenty of fresh fruits and vegetables. Try to fill half of your plate at each meal with fruits and vegetables. ? Eat whole grains, such as whole wheat pasta, brown rice, or whole grain bread. Fill about one quarter of your plate with whole grains. ? Eat low-fat diary products. ? Avoid fatty cuts of meat, processed or cured meats, and poultry with skin. Fill about one quarter of your plate with lean proteins such as fish, chicken without skin, beans, eggs, and tofu. ? Avoid premade and processed foods. These tend to be higher in sodium, added sugar, and fat.  Reduce your daily sodium intake. Most people with hypertension should eat less than 1,500 mg of sodium a day.  Limit alcohol intake to no more than 1 drink a day for nonpregnant women and 2 drinks a day for men. One drink equals 12 oz of beer, 5 oz of wine, or 1 oz of hard liquor. Lifestyle  Work with your health care provider to maintain a healthy body weight, or to lose weight. Ask what an ideal weight is for you.  Get at least 30 minutes of exercise that causes your heart to beat faster (aerobic exercise) most days of the week. Activities may include walking, swimming, or biking.  Include exercise to strengthen your muscles (resistance exercise), such  as weight lifting, as part of your weekly exercise routine. Try to do these types of exercises for 30 minutes at least 3 days a week.  Do not use any products that contain nicotine or tobacco, such as cigarettes and e-cigarettes. If you need help quitting, ask your health care provider.  Control any long-term (chronic) conditions you have, such as high cholesterol or diabetes. Monitoring  Monitor your blood pressure at home as told by your health care provider. Your personal target blood pressure may vary depending on your medical conditions, your age, and other factors.  Have your blood pressure checked regularly, as often as told by your health care provider. Working with your health care provider  Review all the medicines you take with your health care provider because there  may be side effects or interactions.  Talk with your health care provider about your diet, exercise habits, and other lifestyle factors that may be contributing to hypertension.  Visit your health care provider regularly. Your health care provider can help you create and adjust your plan for managing hypertension. Will I need medicine to control my blood pressure? Your health care provider may prescribe medicine if lifestyle changes are not enough to get your blood pressure under control, and if:  Your systolic blood pressure is 130 or higher.  Your diastolic blood pressure is 80 or higher. Take medicines only as told by your health care provider. Follow the directions carefully. Blood pressure medicines must be taken as prescribed. The medicine does not work as well when you skip doses. Skipping doses also puts you at risk for problems. Contact a health care provider if:  You think you are having a reaction to medicines you have taken.  You have repeated (recurrent) headaches.  You feel dizzy.  You have swelling in your ankles.  You have trouble with your vision. Get help right away if:  You develop a  severe headache or confusion.  You have unusual weakness or numbness, or you feel faint.  You have severe pain in your chest or abdomen.  You vomit repeatedly.  You have trouble breathing. Summary  Hypertension is when the force of blood pumping through your arteries is too strong. If this condition is not controlled, it may put you at risk for serious complications.  Your personal target blood pressure may vary depending on your medical conditions, your age, and other factors. For most people, a normal blood pressure is less than 120/80.  Hypertension is managed by lifestyle changes, medicines, or both. Lifestyle changes include weight loss, eating a healthy, low-sodium diet, exercising more, and limiting alcohol. This information is not intended to replace advice given to you by your health care provider. Make sure you discuss any questions you have with your health care provider. Document Released: 03/24/2012 Document Revised: 10/22/2018 Document Reviewed: 05/28/2016 Elsevier Patient Education  2020 [place].  Insomnia Insomnia is a sleep disorder that makes it difficult to fall asleep or stay asleep. Insomnia can cause fatigue, low energy, difficulty concentrating, mood swings, and poor performance at work or school. There are three different ways to classify insomnia:  Difficulty falling asleep.  Difficulty staying asleep.  Waking up too early in the morning. Any type of insomnia can be long-term (chronic) or short-term (acute). Both are common. Short-term insomnia usually lasts for three months or less. Chronic insomnia occurs at least three times a week for longer than three months. What are the causes? Insomnia may be caused by another condition, situation, or substance, such as:  Anxiety.  Certain medicines.  Gastroesophageal reflux disease (GERD) or other gastrointestinal conditions.  Asthma or other breathing conditions.  Restless legs syndrome, sleep apnea,  or other sleep disorders.  Chronic pain.  Menopause.  Stroke.  Abuse of alcohol, tobacco, or illegal drugs.  Mental health conditions, such as depression.  Caffeine.  Neurological disorders, such as Alzheimer's disease.  An overactive thyroid (hyperthyroidism). Sometimes, the cause of insomnia may not be known. What increases the risk? Risk factors for insomnia include:  Gender. Women are affected more often than men.  Age. Insomnia is more common as you get older.  Stress.  Lack of exercise.  Irregular work schedule or working night shifts.  Traveling between different time zones.  Certain medical and mental health conditions.  What are the signs or symptoms? If you have insomnia, the main symptom is having trouble falling asleep or having trouble staying asleep. This may lead to other symptoms, such as:  Feeling fatigued or having low energy.  Feeling nervous about going to sleep.  Not feeling rested in the morning.  Having trouble concentrating.  Feeling irritable, anxious, or depressed. How is this diagnosed? This condition may be diagnosed based on:  Your symptoms and medical history. Your health care provider may ask about: ? Your sleep habits. ? Any medical conditions you have. ? Your mental health.  A physical exam. How is this treated? Treatment for insomnia depends on the cause. Treatment may focus on treating an underlying condition that is causing insomnia. Treatment may also include:  Medicines to help you sleep.  Counseling or therapy.  Lifestyle adjustments to help you sleep better. Follow these instructions at home: Eating and drinking   Limit or avoid alcohol, caffeinated beverages, and cigarettes, especially close to bedtime. These can disrupt your sleep.  Do not eat a large meal or eat spicy foods right before bedtime. This can lead to digestive discomfort that can make it hard for you to sleep. Sleep habits   Keep a sleep  diary to help you and your health care provider figure out what could be causing your insomnia. Write down: ? When you sleep. ? When you wake up during the night. ? How well you sleep. ? How rested you feel the next day. ? Any side effects of medicines you are taking. ? What you eat and drink.  Make your bedroom a dark, comfortable place where it is easy to fall asleep. ? Put up shades or blackout curtains to block light from outside. ? Use a white noise machine to block noise. ? Keep the temperature cool.  Limit screen use before bedtime. This includes: ? Watching TV. ? Using your smartphone, tablet, or computer.  Stick to a routine that includes going to bed and waking up at the same times every day and night. This can help you fall asleep faster. Consider making a quiet activity, such as reading, part of your nighttime routine.  Try to avoid taking naps during the day so that you sleep better at night.  Get out of bed if you are still awake after 15 minutes of trying to sleep. Keep the lights down, but try reading or doing a quiet activity. When you feel sleepy, go back to bed. General instructions  Take over-the-counter and prescription medicines only as told by your health care provider.  Exercise regularly, as told by your health care provider. Avoid exercise starting several hours before bedtime.  Use relaxation techniques to manage stress. Ask your health care provider to suggest some techniques that may work well for you. These may include: ? Breathing exercises. ? Routines to release muscle tension. ? Visualizing peaceful scenes.  Make sure that you drive carefully. Avoid driving if you feel very sleepy.  Keep all follow-up visits as told by your health care provider. This is important. Contact a health care provider if:  You are tired throughout the day.  You have trouble in your daily routine due to sleepiness.  You continue to have sleep problems, or your sleep  problems get worse. Get help right away if:  You have serious thoughts about hurting yourself or someone else. If you ever feel like you may hurt yourself or others, or have thoughts about taking your own life, get help  right away. You can go to your nearest emergency department or call:  Your local emergency services (911 in the [place]  A suicide crisis helpline, such as the [facility] at (321)880-9921. This is open 24 hours a day. Summary  Insomnia is a sleep disorder that makes it difficult to fall asleep or stay asleep.  Insomnia can be long-term (chronic) or short-term (acute).  Treatment for insomnia depends on the cause. Treatment may focus on treating an underlying condition that is causing insomnia.  Keep a sleep diary to help you and your health care provider figure out what could be causing your insomnia. This information is not intended to replace advice given to you by your health care provider. Make sure you discuss any questions you have with your health care provider. Document Released: 06/27/2000 Document Revised: 06/12/2017 Document Reviewed: 04/09/2017 Elsevier Patient Education  2020 [employer].

## 2019-06-17 LAB — LIPID PANEL
Cholesterol: 226 mg/dL — ABNORMAL HIGH (ref 0–200)
HDL: 78.5 mg/dL (ref >39.00)
LDL Cholesterol: 128 mg/dL — ABNORMAL HIGH (ref 0–99)
NonHDL: 147.45
Total CHOL/HDL Ratio: 3
Triglycerides: 97 mg/dL (ref 0.0–149.0)
VLDL: 19.4 mg/dL (ref 0.0–40.0)

## 2019-06-17 LAB — BASIC METABOLIC PANEL
BUN: 12 mg/dL (ref 6–23)
CO2: 26 mEq/L (ref 19–32)
Calcium: 9.3 mg/dL (ref 8.4–10.5)
Chloride: 101 mEq/L (ref 96–112)
Creatinine, Ser: 0.71 mg/dL (ref 0.40–1.20)
GFR: 105.77 mL/min (ref >60.00)
Glucose, Bld: 96 mg/dL (ref 70–99)
Potassium: 3.9 mEq/L (ref 3.5–5.1)
Sodium: 136 mEq/L (ref 135–145)

## 2019-06-17 LAB — CBC
HCT: 37.7 % (ref 36.0–46.0)
Hemoglobin: 12.8 g/dL (ref 12.0–15.0)
MCHC: 33.9 g/dL (ref 30.0–36.0)
MCV: 99.2 fl (ref 78.0–100.0)
Platelets: 291 10*3/uL (ref 150.0–400.0)
RBC: 3.8 Mil/uL — ABNORMAL LOW (ref 3.87–5.11)
RDW: 12.3 % (ref 11.5–15.5)
WBC: 5.7 10*3/uL (ref 4.0–10.5)

## 2019-06-17 LAB — HEMOGLOBIN A1C: Hgb A1c MFr Bld: 5.5 % (ref 4.6–6.5)

## 2019-06-17 LAB — TSH: TSH: 1.18 u[IU]/mL (ref 0.35–4.50)

## 2019-07-05 ENCOUNTER — Telehealth (INDEPENDENT_AMBULATORY_CARE_PROVIDER_SITE_OTHER): Payer: 59 | Admitting: Family Medicine

## 2019-07-05 DIAGNOSIS — R059 Cough, unspecified: Secondary | ICD-10-CM

## 2019-07-05 DIAGNOSIS — R5383 Other fatigue: Secondary | ICD-10-CM

## 2019-07-05 DIAGNOSIS — Z20822 Contact with and (suspected) exposure to covid-19: Secondary | ICD-10-CM

## 2019-07-05 DIAGNOSIS — Z20828 Contact with and (suspected) exposure to other viral communicable diseases: Secondary | ICD-10-CM | POA: Diagnosis not present

## 2019-07-05 DIAGNOSIS — R05 Cough: Secondary | ICD-10-CM | POA: Diagnosis not present

## 2019-07-05 NOTE — Progress Notes (Signed)
Virtual Visit via Video Note  I connected with Allison Hoffman on 07/05/19 at  2:00 PM EST by a video enabled telemedicine application 2/2 [id number] pandemic and verified that I am speaking with the correct person using two identifiers.  Location patient: home Location provider:work or home office Persons participating in the virtual visit: patient, provider  I discussed the limitations of evaluation and management by telemedicine and the availability of in person appointments. The patient expressed understanding and agreed to proceed.   HPI: Pt is a 48 yo female with no sig pmh.  Pt went home to visit family on 12/4.  Pt was briefly around her aunt who had on a mask.  A wk later her aunt called and said she tested positive for COVID-19.   Pt developed a tickle/sore throat 12/7.  Then had waves of nausea.  She developed a cough this wk, mild lethargy, decreased appetite.  Denies HAs, fever, rhinorrhea, ear pain or pressure, emesis, diarrhea.  Pt has taken raw garlic, ginger, honey, and lemon tea for her symptoms.  Pt has still been able to go to work.  Pt denies h/o seasonal allergies.  ROS: See pertinent positives and negatives per HPI.  Past Medical History:  Diagnosis Date  . Chicken pox   . Depression   . Hypertension   . 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 Screen for STD (sexually transmitted disease)     Past Surgical History:  Procedure Laterality Date  . BREAST BIOPSY  1997  . OOPHORECTOMY      Family History  Problem Relation Age of Onset  . Cancer Mother   . Hypertension Mother   . Mental illness Mother   . Kidney disease Father      Current Outpatient Medications:  .  hydrochlorothiazide (HYDRODIURIL) 12.5 MG tablet, TK 1 T PO QAM, Disp: 30 tablet, Rfl: 1 .  meclizine (ANTIVERT) 25 MG tablet, Take 1 tablet (25 mg total) by mouth 3 (three) times daily as needed for dizziness. (Patient not taking: Reported on 06/16/2019), Disp: 30 tablet, Rfl: 0 .  Norgestimate-Ethinyl Estradiol  Triphasic (ORTHO TRI-CYCLEN, 28,) 0.18/0.215/0.25 MG-35 MCG tablet, Take 1 tablet by mouth daily., Disp: , Rfl:   EXAM:  VITALS per patient if applicable: RR between 53-61 bpm  GENERAL: alert, oriented, appears well and in no acute distress  HEENT: Wearing a mask, atraumatic, conjunctiva clear, no obvious abnormalities on inspection of external nose and ears  NECK: normal movements of the head and neck  LUNGS: on inspection no signs of respiratory distress, breathing rate appears normal, no obvious gross SOB, gasping or wheezing  CV: no obvious cyanosis  MS: moves all visible extremities without noticeable abnormality  PSYCH/NEURO: pleasant and cooperative, no obvious depression or anxiety, speech and thought processing grossly intact  ASSESSMENT AND PLAN:  Discussed the following assessment and plan:  Cough  Lethargy  Exposure to COVID-19 virus  -Discussed possible causes including viral etiolgy such as URI, COVID, influenza.  Also consider seasonal allergies with post nasal drainage causing throat irritation and nausea. -continue supportive care. -consider starting Allegra or Zyrtec. -discussed obtaining COVID testing.  Given info on area testing sites. -pt advised to self quarantine while awaiting results -given precautions  F/u prn for continued symptoms   I discussed the assessment and treatment plan with the patient. The patient was provided an opportunity to ask questions and all were answered. The patient agreed with the plan and demonstrated an understanding of the instructions.   The patient  was advised to call back or seek an in-person evaluation if the symptoms worsen or if the condition fails to improve as anticipated.   Billie Ruddy, MD

## 2019-08-08 ENCOUNTER — Telehealth: Payer: Self-pay | Admitting: Family Medicine

## 2019-08-08 ENCOUNTER — Other Ambulatory Visit: Payer: 59

## 2019-08-08 ENCOUNTER — Telehealth (INDEPENDENT_AMBULATORY_CARE_PROVIDER_SITE_OTHER): Payer: 59 | Admitting: Family Medicine

## 2019-08-08 ENCOUNTER — Other Ambulatory Visit: Payer: Self-pay

## 2019-08-08 ENCOUNTER — Ambulatory Visit (INDEPENDENT_AMBULATORY_CARE_PROVIDER_SITE_OTHER): Payer: 59

## 2019-08-08 DIAGNOSIS — R05 Cough: Secondary | ICD-10-CM | POA: Diagnosis not present

## 2019-08-08 DIAGNOSIS — R059 Cough, unspecified: Secondary | ICD-10-CM

## 2019-08-08 DIAGNOSIS — I10 Essential (primary) hypertension: Secondary | ICD-10-CM | POA: Insufficient documentation

## 2019-08-08 NOTE — Telephone Encounter (Signed)
Patient scheduled for a virtual visit today with Dr. Salomon Fick at 10:30.

## 2019-08-08 NOTE — Addendum Note (Signed)
Addended by: Young Berry T on: 08/08/2019 02:11 PM   Modules accepted: Orders

## 2019-08-08 NOTE — Progress Notes (Signed)
Virtual Visit via Video Note  I connected with Allison Hoffman on 08/08/19 at 10:30 AM EST by a video enabled telemedicine application 2/2 COVID-19 pandemic and verified that I am speaking with the correct person using two identifiers.  Location patient: home Location provider:work or home office Persons participating in the virtual visit: patient, provider  I discussed the limitations of evaluation and management by telemedicine and the availability of in person appointments. The patient expressed understanding and agreed to proceed.   HPI: Pt is a 49 yo female with continued cough since last OFV 07/05/19.  Pt feels like cough has become worse.  Able to sleep at night but coughing a lot during the day.  Pt no longer having the "tickle" in her throat or as much nausea.  Endorses hoarseness, rhinorrhea, states has "gall" at times- something comes up that causes her to swallow a few times.  States at times has post tussive emesis, feels fatigued.  Denies ear pain/pressure, fever, HAs, sick contacs.  Taking Hall's cough drops.  Concerned about pneumonia.  Denies h/o acid reflux.  Yesterday ate chicken pot pie, an orange, grits, eggs, cereal, blueberries, sausage.  ROS: See pertinent positives and negatives per HPI.  Past Medical History:  Diagnosis Date  . Chicken pox   . Depression   . Hypertension   . 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 Screen for STD (sexually transmitted disease)     Past Surgical History:  Procedure Laterality Date  . BREAST BIOPSY  1997  . OOPHORECTOMY      Family History  Problem Relation Age of Onset  . Cancer Mother   . Hypertension Mother   . Mental illness Mother   . Kidney disease Father      Current Outpatient Medications:  .  hydrochlorothiazide (HYDRODIURIL) 12.5 MG tablet, TK 1 T PO QAM, Disp: 30 tablet, Rfl: 1 .  meclizine (ANTIVERT) 25 MG tablet, Take 1 tablet (25 mg total) by mouth 3 (three) times daily as needed for dizziness. (Patient not taking: Reported on  06/16/2019), Disp: 30 tablet, Rfl: 0 .  Norgestimate-Ethinyl Estradiol Triphasic (ORTHO TRI-CYCLEN, 28,) 0.18/0.215/0.25 MG-35 MCG tablet, Take 1 tablet by mouth daily., Disp: , Rfl:   EXAM:  VITALS per patient if applicable:  RR between 12-20 bpm  GENERAL: alert, oriented, appears well and in no acute distress  HEENT: atraumatic, conjunctiva clear, no obvious abnormalities on inspection of external nose and ears  NECK: normal movements of the head and neck  LUNGS: Sporadic deep cough.  on inspection no signs of respiratory distress, breathing rate appears normal, no obvious gross SOB, gasping or wheezing  CV: no obvious cyanosis  MS: moves all visible extremities without noticeable abnormality  PSYCH/NEURO: pleasant and cooperative, no obvious depression or anxiety, speech and thought processing grossly intact  ASSESSMENT AND PLAN:  Discussed the following assessment and plan:  Cough  -discussed possible causes including post viral reactive process/bronchiits, acid reflux, pna, allergies. -continue supportive care.   -will obtain imaging and adjust treatment as needed. - Plan: DG Chest 2 View  F/u prn   I discussed the assessment and treatment plan with the patient. The patient was provided an opportunity to ask questions and all were answered. The patient agreed with the plan and demonstrated an understanding of the instructions.   The patient was advised to call back or seek an in-person evaluation if the symptoms worsen or if the condition fails to improve as anticipated.   Deeann Saint, MD

## 2019-08-11 ENCOUNTER — Other Ambulatory Visit: Payer: Self-pay

## 2019-08-11 MED ORDER — OMEPRAZOLE 20 MG PO CPDR: 20.0000 mg | DELAYED_RELEASE_CAPSULE | Freq: Every day | ORAL | 0 refills | Status: DC

## 2019-09-09 ENCOUNTER — Other Ambulatory Visit: Payer: Self-pay | Admitting: Family Medicine

## 2019-10-30 ENCOUNTER — Encounter (HOSPITAL_COMMUNITY): Payer: Self-pay | Admitting: Emergency Medicine

## 2019-10-30 ENCOUNTER — Emergency Department (HOSPITAL_COMMUNITY)
Admission: EM | Admit: 2019-10-30 | Discharge: 2019-10-30 | Disposition: A | Discharge status: Home / Self Care | Payer: 59 | Attending: Emergency Medicine | Admitting: Emergency Medicine

## 2019-10-30 ENCOUNTER — Other Ambulatory Visit: Payer: Self-pay

## 2019-10-30 DIAGNOSIS — T50Z95A Adverse effect of other vaccines and biological substances, initial encounter: Secondary | ICD-10-CM

## 2019-10-30 DIAGNOSIS — Z79899 Other long term (current) drug therapy: Secondary | ICD-10-CM | POA: Insufficient documentation

## 2019-10-30 DIAGNOSIS — R220 Localized swelling, mass and lump, head: Secondary | ICD-10-CM | POA: Diagnosis not present

## 2019-10-30 DIAGNOSIS — I10 Essential (primary) hypertension: Secondary | ICD-10-CM | POA: Insufficient documentation

## 2019-10-30 NOTE — Discharge Instructions (Addendum)
If your symptoms recur take 50 mg of Benadryl by mouth and call 911 or return to the emergency department urgently. Get help right away if: You develop symptoms of an allergic reaction. You may notice them soon after you are exposed to a substance. Symptoms may include: Flushed skin. Hives. Swelling of the eyes, lips, face, mouth, tongue, or throat. Difficulty breathing, speaking, or swallowing. Wheezing. Dizziness or light-headedness. Fainting. Pain or cramping in the abdomen. Vomiting. Diarrhea.

## 2019-10-30 NOTE — ED Triage Notes (Signed)
Pt reports slight swelling to lips and under eyes after getting 2nd COVID vaccine today.  States she felt fine the initial 15 min waiting period but while running errands afterwards started noticing swelling.  Denies SOB or sore throat and states she doesn't feel that it is getting any worse.  Pt instructed to notify staff of any changes.

## 2019-10-30 NOTE — ED Provider Notes (Signed)
[facility] EMERGENCY DEPARTMENT Provider Note   CSN: [id number] Arrival date & time: 10/30/19  1536     History Chief Complaint  Patient presents with  . Facial Swelling    Allison Hoffman is a 49 y.o. female pertinent for facial swelling.  Patient had her second Pfizer vaccine at 2 PM today.  She waited for her obligatory 15 minutes after her vaccine and then about 1 hour later.  Noticing some swelling in her upper lip and upper face.  Patient came directly to the emergency department.  She did not take any medications.  She denies any involvement of her tongue, throat.  She did not have any wheezing.  She had a HA her upper arms and upper legs but denies development of hives.  Her symptoms have completely resolved since that time.  HPI     Past Medical History:  Diagnosis Date  . Chicken pox   . Depression   . Hypertension   . 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 Screen for STD (sexually transmitted disease)     Patient Active Problem List   Diagnosis Date Noted  . Essential hypertension 08/08/2019    Past Surgical History:  Procedure Laterality Date  . BREAST BIOPSY  1997  . OOPHORECTOMY       OB History   No obstetric history on file.     Family History  Problem Relation Age of Onset  . Cancer Mother   . Hypertension Mother   . Mental illness Mother   . Kidney disease Father     Social History   Tobacco Use  . Smoking status: Never Smoker  . Smokeless tobacco: Never Used  Substance Use Topics  . Alcohol use: No  . Drug use: No    Home Medications Prior to Admission medications   Medication Sig Start Date End Date Taking? Authorizing Provider  hydrochlorothiazide (HYDRODIURIL) 12.5 MG tablet TK 1 T PO QAM 09/15/18   Deeann Saint, MD  meclizine (ANTIVERT) 25 MG tablet Take 1 tablet (25 mg total) by mouth 3 (three) times daily as needed for dizziness. Patient not taking: Reported on 06/16/2019 01/18/18   Deeann Saint, MD    Norgestimate-Ethinyl Estradiol Triphasic (ORTHO TRI-CYCLEN, 28,) 0.18/0.215/0.25 MG-35 MCG tablet Take 1 tablet by mouth daily.    [provider]  omeprazole (PRILOSEC) 20 MG capsule TAKE 1 CAPSULE(20 MG) BY MOUTH DAILY 09/09/19   Deeann Saint, MD    Allergies    Amlodipine, Codeine, and Tylenol with codeine #3 [acetaminophen-codeine]  Review of Systems   Review of Systems Ten systems reviewed and are negative for acute change, except as noted in the HPI.   Physical Exam Updated Vital Signs BP (!) 147/87   Pulse 80   Temp 98.7 F (37.1 C) (Oral)   Resp 18   LMP 10/23/2019   SpO2 100%   Physical Exam Vitals and nursing note reviewed.  Constitutional:      General: She is not in acute distress.    Appearance: She is well-developed. She is not diaphoretic.  HENT:     Head: Normocephalic and atraumatic.     Mouth/Throat:     Mouth: Mucous membranes are moist.     Pharynx: Uvula midline.     Comments: Normal phonation Eyes:     General: No scleral icterus.    Conjunctiva/sclera: Conjunctivae normal.  Cardiovascular:     Rate and Rhythm: Normal rate and regular rhythm.  Heart sounds: Normal heart sounds. No murmur. No friction rub. No gallop.   Pulmonary:     Effort: Pulmonary effort is normal. No respiratory distress.     Breath sounds: Normal breath sounds. No wheezing.  Abdominal:     General: Bowel sounds are normal. There is no distension.     Palpations: Abdomen is soft. There is no mass.     Tenderness: There is no abdominal tenderness. There is no guarding.  Musculoskeletal:     Cervical back: Normal range of motion.  Skin:    General: Skin is warm and dry.     Findings: No rash.  Neurological:     Mental Status: She is alert and oriented to person, place, and time.  Psychiatric:        Behavior: Behavior normal.     ED Results / Procedures / Treatments   Labs (all labs ordered are listed, but only abnormal results are displayed) Labs  Reviewed - No data to display  EKG None  Radiology No results found.  Procedures Procedures (including critical care time)  Medications Ordered in ED Medications - No data to display  ED Course  I have reviewed the triage vital signs and the nursing notes.  Pertinent labs & imaging results that were available during my care of the patient were reviewed by me and considered in my medical decision making (see chart for details).    MDM Rules/Calculators/A&P                      Patient here with facial swelling after getting her second Covid vaccination her symptoms have completely resolved.  Does not appear that she had any evidence of anaphylaxis.  She denies syncope, vomiting, severe flushing.  Patient declines to take any medication at this time and does not want prednisone.  I discussed that if she had to have any recurrence of symptoms she should milligrams of Benadryl and call 911.  Patient and her husband agree that they can observe at home.  Has been 5 hours since her vaccination.  Feel that she is safe reliable to return for any worsening of her symptoms.  She is appropriate for discharge at this time Final Clinical Impression(s) / ED Diagnoses Final diagnoses:  None    Rx / DC Orders ED Discharge Orders    None       Margarita Mail, PA-C 10/30/19 2001    Tegeler, Gwenyth Allegra, MD 10/31/19 0003

## 2019-12-13 ENCOUNTER — Encounter: Payer: Self-pay | Admitting: Family Medicine

## 2019-12-13 ENCOUNTER — Ambulatory Visit (INDEPENDENT_AMBULATORY_CARE_PROVIDER_SITE_OTHER): Payer: 59 | Admitting: Family Medicine

## 2019-12-13 ENCOUNTER — Other Ambulatory Visit: Payer: Self-pay

## 2019-12-13 VITALS — BP 134/78 | HR 81 | Temp 98.2°F | Wt 132.5 lb

## 2019-12-13 DIAGNOSIS — S60861A Insect bite (nonvenomous) of right wrist, initial encounter: Secondary | ICD-10-CM

## 2019-12-13 DIAGNOSIS — W57XXXA Bitten or stung by nonvenomous insect and other nonvenomous arthropods, initial encounter: Secondary | ICD-10-CM | POA: Diagnosis not present

## 2019-12-13 NOTE — Progress Notes (Signed)
  Subjective:     Patient ID: Allison Hoffman, female   DOB: [birth year], 49 y.o.   MRN: [id number]  HPI Allison Hoffman is seen with possible bite on her right wrist.  She was down in [facility] visiting her son this past weekend.  She thinks she may have had a bite Saturday night or early Sunday morning.  She woke up some itching and mild swelling dorsal aspect right wrist.  Her itching is continued today.  She does not have any pain.  No fevers or chills.  Husband has no rashes.  She has not noted any other areas of rash.  She has not applied anything topically  Past Medical History:  Diagnosis Date  . Chicken pox   . Depression   . Hypertension   . 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 Screen for STD (sexually transmitted disease)    Past Surgical History:  Procedure Laterality Date  . BREAST BIOPSY  1997  . OOPHORECTOMY      reports that she has never smoked. She has never used smokeless tobacco. She reports that she does not drink alcohol or use drugs. family history includes Cancer in her mother; Hypertension in her mother; Kidney disease in her father; Mental illness in her mother. Allergies  Allergen Reactions  . Amlodipine     Swelling unable to breathe   . Codeine   . Tylenol With Codeine #3 [Acetaminophen-Codeine] Hives     Review of Systems  Constitutional: Negative for chills and fever.  Hematological: Negative for adenopathy.       Objective:   Physical Exam Vitals reviewed.  Constitutional:      Appearance: Normal appearance.  Cardiovascular:     Rate and Rhythm: Normal rate and regular rhythm.  Skin:    Comments: Small punctate area dorsum right wrist which has some mild serous type crusting at the surface.  No induration.  No fluctuance.  No pustules.  No vesicles.  Minimal surrounding erythema.  Nontender.  Neurological:     Mental Status: She is alert.        Assessment:     Probable allergic reaction to some type of bite.  No evidence for cellulitis     Plan:     -Over-the-counter hydrocortisone cream couple times daily -Consider over-the-counter Xyzal or Zyrtec once daily -Follow-up for any fever, progressive redness, swelling, or any associated pain  Kristian Covey MD [facility]

## 2019-12-13 NOTE — Patient Instructions (Signed)
Suspect localized reaction.  Try OTC Zyrtec or Xyzal one daily  Consider OTC hydrocortisone cream.

## 2019-12-26 LAB — HM MAMMOGRAPHY

## 2020-01-05 ENCOUNTER — Encounter: Payer: Self-pay | Admitting: Family Medicine

## 2020-03-16 ENCOUNTER — Other Ambulatory Visit: Payer: Self-pay

## 2020-03-16 ENCOUNTER — Ambulatory Visit: Payer: 59 | Admitting: Family Medicine

## 2020-07-24 ENCOUNTER — Telehealth: Payer: Self-pay | Admitting: Family Medicine

## 2020-07-24 NOTE — Telephone Encounter (Signed)
Patient has a cpe scheduled for 1/21 and wanted to get labs before her appointment, Is this ok?

## 2020-07-27 NOTE — Telephone Encounter (Signed)
Will wait to get labs at appt.

## 2020-08-01 NOTE — Telephone Encounter (Signed)
Spoke with pt advised that she will have labs done after her appointment with Dr Salomon Fick, pt advised to at least fast 8-10 hrs before her app, verbalized understanding

## 2020-08-03 ENCOUNTER — Ambulatory Visit (INDEPENDENT_AMBULATORY_CARE_PROVIDER_SITE_OTHER): Payer: 59 | Admitting: Family Medicine

## 2020-08-03 ENCOUNTER — Encounter: Payer: Self-pay | Admitting: Family Medicine

## 2020-08-03 ENCOUNTER — Other Ambulatory Visit: Payer: Self-pay

## 2020-08-03 VITALS — BP 136/78 | HR 58 | Temp 97.6°F | Ht 63.5 in | Wt 136.0 lb

## 2020-08-03 DIAGNOSIS — G8929 Other chronic pain: Secondary | ICD-10-CM | POA: Diagnosis not present

## 2020-08-03 DIAGNOSIS — Z0000 Encounter for general adult medical examination without abnormal findings: Secondary | ICD-10-CM

## 2020-08-03 DIAGNOSIS — I10 Essential (primary) hypertension: Secondary | ICD-10-CM

## 2020-08-03 DIAGNOSIS — M25562 Pain in left knee: Secondary | ICD-10-CM | POA: Diagnosis not present

## 2020-08-03 DIAGNOSIS — E7800 Pure hypercholesterolemia, unspecified: Secondary | ICD-10-CM

## 2020-08-03 DIAGNOSIS — Z1211 Encounter for screening for malignant neoplasm of colon: Secondary | ICD-10-CM

## 2020-08-03 DIAGNOSIS — T50B95A Adverse effect of other viral vaccines, initial encounter: Secondary | ICD-10-CM

## 2020-08-03 LAB — CBC WITH DIFFERENTIAL/PLATELET
Basophils Absolute: 0 10*3/uL (ref 0.0–0.1)
Basophils Relative: 0.8 % (ref 0.0–3.0)
Eosinophils Absolute: 0 10*3/uL (ref 0.0–0.7)
Eosinophils Relative: 0.3 % (ref 0.0–5.0)
HCT: 34.8 % — ABNORMAL LOW (ref 36.0–46.0)
Hemoglobin: 12 g/dL (ref 12.0–15.0)
Lymphocytes Relative: 31.4 % (ref 12.0–46.0)
Lymphs Abs: 1.7 10*3/uL (ref 0.7–4.0)
MCHC: 34.5 g/dL (ref 30.0–36.0)
MCV: 97.7 fl (ref 78.0–100.0)
Monocytes Absolute: 0.4 10*3/uL (ref 0.1–1.0)
Monocytes Relative: 7 % (ref 3.0–12.0)
Neutro Abs: 3.2 10*3/uL (ref 1.4–7.7)
Neutrophils Relative %: 60.5 % (ref 43.0–77.0)
Platelets: 266 10*3/uL (ref 150.0–400.0)
RBC: 3.56 Mil/uL — ABNORMAL LOW (ref 3.87–5.11)
RDW: 12.3 % (ref 11.5–15.5)
WBC: 5.4 10*3/uL (ref 4.0–10.5)

## 2020-08-03 LAB — BASIC METABOLIC PANEL
BUN: 11 mg/dL (ref 6–23)
CO2: 27 mEq/L (ref 19–32)
Calcium: 9.3 mg/dL (ref 8.4–10.5)
Chloride: 98 mEq/L (ref 96–112)
Creatinine, Ser: 0.81 mg/dL (ref 0.40–1.20)
GFR: 84.68 mL/min (ref >60.00)
Glucose, Bld: 76 mg/dL (ref 70–99)
Potassium: 4 mEq/L (ref 3.5–5.1)
Sodium: 134 mEq/L — ABNORMAL LOW (ref 135–145)

## 2020-08-03 LAB — HEMOGLOBIN A1C: Hgb A1c MFr Bld: 5.6 % (ref 4.6–6.5)

## 2020-08-03 LAB — LIPID PANEL
Cholesterol: 208 mg/dL — ABNORMAL HIGH (ref 0–200)
HDL: 71.3 mg/dL (ref >39.00)
LDL Cholesterol: 114 mg/dL — ABNORMAL HIGH (ref 0–99)
NonHDL: 136.3
Total CHOL/HDL Ratio: 3
Triglycerides: 110 mg/dL (ref 0.0–149.0)
VLDL: 22 mg/dL (ref 0.0–40.0)

## 2020-08-03 MED ORDER — EPINEPHRINE 0.3 MG/0.3ML IJ SOAJ: 0.3000 mg | INTRAMUSCULAR | 2 refills | Status: AC | PRN

## 2020-08-03 NOTE — Progress Notes (Signed)
Subjective:     Allison Hoffman is a 50 y.o. female and is here for a comprehensive physical exam.  Pt had 2 doses of pfizer covid vaccine, however developed facial edema, LE pruritis 15 min after receiving vaccine.  Pt reported sx to national vaccine rxn database.  Pt had similar rxn after eating salmon.  Able to eat certain fish.  Pt with h/o L knee pain.  Noticed 2 yrs ago after exercising.  L knee feels tight and swells if runs outdoors or on treadmill.    Social History   Socioeconomic History  . Marital status: Married    Spouse name: Not on file  . Number of children: Not on file  . Years of education: Not on file  . Highest education level: Not on file  Occupational History  . Not on file  Tobacco Use  . Smoking status: Never Smoker  . Smokeless tobacco: Never Used  Substance and Sexual Activity  . Alcohol use: No  . Drug use: No  . Sexual activity: Yes  Other Topics Concern  . Not on file  Social History Narrative  . Not on file   Social Determinants of Health   Financial Resource Strain: Not on file  Food Insecurity: Not on file  Transportation Needs: Not on file  Physical Activity: Not on file  Stress: Not on file  Social Connections: Not on file  Intimate Partner Violence: Not on file   Health Maintenance  Topic Date Due  . Hepatitis C Screening  Never done  . HIV Screening  Never done  . COLONOSCOPY (Pts 45-28yrs Insurance coverage will need to be confirmed)  Never done  . PAP SMEAR-Modifier  09/10/2020  . TETANUS/TDAP  08/03/2021 (Originally 03/26/1989)  . INFLUENZA VACCINE  10/11/2021 (Originally 02/12/2020)  . MAMMOGRAM  12/25/2021    The following portions of the patient's history were reviewed and updated as appropriate: allergies, current medications, past family history, past medical history, past social history, past surgical history and problem list.  Review of Systems Pertinent items noted in HPI and remainder of comprehensive ROS otherwise  negative.   Objective:    BP 136/78 (BP Location: Right Arm, Patient Position: Sitting, Cuff Size: Normal)   Pulse (!) 58   Temp 97.6 F (36.4 C) (Oral)   Ht 5' 3.5" (1.613 m)   Wt 136 lb (61.7 kg)   LMP 07/25/2020 (Exact Date)   SpO2 98%   BMI 23.71 kg/m  General appearance: alert, cooperative and no distress Head: Normocephalic, without obvious abnormality, atraumatic Eyes: conjunctivae/corneas clear. PERRL, EOM's intact. Fundi benign. Ears: normal TM's and external ear canals both ears Nose: Nares normal. Septum midline. Mucosa normal. No drainage or sinus tenderness. Throat: lips, mucosa, and tongue normal; teeth and gums normal Neck: no adenopathy, no carotid bruit, no JVD, supple, symmetrical, trachea midline and thyroid not enlarged, symmetric, no tenderness/mass/nodules Lungs: clear to auscultation bilaterally Heart: regular rate and rhythm, S1, S2 normal, no murmur, click, rub or gallop Abdomen: soft, non-tender; bowel sounds normal; no masses,  no organomegaly Extremities: Moves all four, normal tone.  L knee without edema, erythema, or TTP at joint line or patella.  TTP superior to tibial tuberosity.  Increased laxity of L patellar tendon.  Pulses: 2+ and symmetric Skin: Skin color, texture, turgor normal. No rashes or lesions Lymph nodes: Cervical, supraclavicular, and axillary nodes normal. Neurologic: Alert and oriented X 3, normal strength and tone. Normal symmetric reflexes. Normal coordination and gait  Assessment:    Healthy female exam.      Plan:     Anticipatory guidance given including wearing seatbelts, smoke detectors in the home, increasing physical activity, increasing p.o. intake of water and vegetables. -We will obtain labs -Pap scheduled with OB/Gyn. -Pt to schedule mammogram -We will place referral for colonoscopy. -given handout -next CPE in 1 yr. See After Visit Summary for Counseling Recommendations    Essential hypertension  -elevated.   Recheck. -Continue HCTZ 12.5 mg daily -Continue lifestyle modification - Plan: Basic metabolic panel  Chronic pain of left knee  -likely 2/2 bursitis versus patellofemoral pain syndrome. -Discussed supportive care including exercising to strengthen patellar tendon, heat, ice, massage, stretching, NSAIDs or Tylenol as needed -Given exercises -We will continue to monitor - Plan: CBC with Differential/Platelet  Adverse reaction to COVID-19 vaccine  -Reaction reported to national database -Given facial edema after second Pfizer COVID-vaccine will wait on receiving booster dose. -Discussed EpiPen use. Patient encouraged to take Benadryl with her given recent reaction to salmon -We will place referral for allergy testing - Plan: Ambulatory referral to Allergy, EPINEPHrine 0.3 mg/0.3 mL IJ SOAJ injection  Pure hypercholesterolemia -lifestyle modifications - Plan: Hemoglobin A1c, Lipid panel  Colon cancer screening  - Plan: Ambulatory referral to Gastroenterology  Follow-up as needed  Abbe Amsterdam, MD

## 2020-08-03 NOTE — Patient Instructions (Addendum)
Consider using [employer] ear drops with grapefruit seed extract and tea tree oil for vertigo.  This can be found online or at your local health food store.  1 fluid ounce is approximately $6.99  You can improve your cholesterol by -limiting your intake of processed foods and foods high in saturated fats.  -increasing your physical activity -increasing your intake of vegetables, healthy fish (bluefin tuna, wild salmon, and sardines), whole grains and fiber rich foods .   -You can also use extra virgin olive oil instead of butter. -If you smoke, quitting can decrease LDL cholesterol and raise HDL cholesterol.   Preventive Care 21-62 Years Old, Female Preventive care refers to lifestyle choices and visits with your health care provider that can promote health and wellness. This includes:  A yearly physical exam. This is also called an annual wellness visit.  Regular dental and eye exams.  Immunizations.  Screening for certain conditions.  Healthy lifestyle choices, such as: ? Eating a healthy diet. ? Getting regular exercise. ? Not using drugs or products that contain nicotine and tobacco. ? Limiting alcohol use. What can I expect for my preventive care visit? Physical exam Your health care provider will check your:  Height and weight. These may be used to calculate your BMI (body mass index). BMI is a measurement that tells if you are at a healthy weight.  Heart rate and blood pressure.  Body temperature.  Skin for abnormal spots. Counseling Your health care provider may ask you questions about your:  Past medical problems.  Family's medical history.  Alcohol, tobacco, and drug use.  Emotional well-being.  Home life and relationship well-being.  Sexual activity.  Diet, exercise, and sleep habits.  Work and work Statistician.  Access to firearms.  Method of birth control.  Menstrual cycle.  Pregnancy history. What immunizations do I need? Vaccines are  usually given at various ages, according to a schedule. Your health care provider will recommend vaccines for you based on your age, medical history, and lifestyle or other factors, such as travel or where you work.   What tests do I need? Blood tests  Lipid and cholesterol levels. These may be checked every 5 years, or more often if you are over 46 years old.  Hepatitis C test.  Hepatitis B test. Screening  Lung cancer screening. You may have this screening every year starting at age 70 if you have a 30-pack-year history of smoking and currently smoke or have quit within the past 15 years.  Colorectal cancer screening. ? All adults should have this screening starting at age 73 and continuing until age 39. ? Your health care provider may recommend screening at age 34 if you are at increased risk. ? You will have tests every 1-10 years, depending on your results and the type of screening test.  Diabetes screening. ? This is done by checking your blood sugar (glucose) after you have not eaten for a while (fasting). ? You may have this done every 1-3 years.  Mammogram. ? This may be done every 1-2 years. ? Talk with your health care provider about when you should start having regular mammograms. This may depend on whether you have a family history of breast cancer.  BRCA-related cancer screening. This may be done if you have a family history of breast, ovarian, tubal, or peritoneal cancers.  Pelvic exam and Pap test. ? This may be done every 3 years starting at age 8. ? Starting at age 29, this  may be done every 5 years if you have a Pap test in combination with an HPV test. Other tests  STD (sexually transmitted disease) testing, if you are at risk.  Bone density scan. This is done to screen for osteoporosis. You may have this scan if you are at high risk for osteoporosis. Talk with your health care provider about your test results, treatment options, and if necessary, the need for  more tests. Follow these instructions at home: Eating and drinking  Eat a diet that includes fresh fruits and vegetables, whole grains, lean protein, and low-fat dairy products.  Take vitamin and mineral supplements as recommended by your health care provider.  Do not drink alcohol if: ? Your health care provider tells you not to drink. ? You are pregnant, may be pregnant, or are planning to become pregnant.  If you drink alcohol: ? Limit how much you have to 0-1 drink a day. ? Be aware of how much alcohol is in your drink. In the [place] one drink equals one 12 oz bottle of beer (355 mL), one 5 oz glass of wine (148 mL), or one 1 oz glass of hard liquor (44 mL).   Lifestyle  Take daily care of your teeth and gums. Brush your teeth every morning and night with fluoride toothpaste. Floss one time each day.  Stay active. Exercise for at least 30 minutes 5 or more days each week.  Do not use any products that contain nicotine or tobacco, such as cigarettes, e-cigarettes, and chewing tobacco. If you need help quitting, ask your health care provider.  Do not use drugs.  If you are sexually active, practice safe sex. Use a condom or other form of protection to prevent STIs (sexually transmitted infections).  If you do not wish to become pregnant, use a form of birth control. If you plan to become pregnant, see your health care provider for a prepregnancy visit.  If told by your health care provider, take low-dose aspirin daily starting at age 42.  Find healthy ways to cope with stress, such as: ? Meditation, yoga, or listening to music. ? Journaling. ? Talking to a trusted person. ? Spending time with friends and family. Safety  Always wear your seat belt while driving or riding in a vehicle.  Do not drive: ? If you have been drinking alcohol. Do not ride with someone who has been drinking. ? When you are tired or distracted. ? While texting.  Wear a helmet and other protective  equipment during sports activities.  If you have firearms in your house, make sure you follow all gun safety procedures. What's next?  Visit your health care provider once a year for an annual wellness visit.  Ask your health care provider how often you should have your eyes and teeth checked.  Stay up to date on all vaccines. This information is not intended to replace advice given to you by your health care provider. Make sure you discuss any questions you have with your health care provider. Document Revised: 04/03/2020 Document Reviewed: 03/11/2018 Elsevier Patient Education  2021 [place] Your Hypertension Hypertension, also called high blood pressure, is when the force of the blood pressing against the walls of the arteries is too strong. Arteries are blood vessels that carry blood from your heart throughout your body. Hypertension forces the heart to work harder to pump blood and may cause the arteries to become narrow or stiff. Understanding blood pressure readings Your personal target  blood pressure may vary depending on your medical conditions, your age, and other factors. A blood pressure reading includes a higher number over a lower number. Ideally, your blood pressure should be below 120/80. You should know that:  The first, or top, number is called the systolic pressure. It is a measure of the pressure in your arteries as your heart beats.  The second, or bottom number, is called the diastolic pressure. It is a measure of the pressure in your arteries as the heart relaxes. Blood pressure is classified into four stages. Based on your blood pressure reading, your health care provider may use the following stages to determine what type of treatment you need, if any. Systolic pressure and diastolic pressure are measured in a unit called mmHg. Normal  Systolic pressure: below 353.  Diastolic pressure: below 80. Elevated  Systolic pressure: 614-431.  Diastolic  pressure: below 80. Hypertension stage 1  Systolic pressure: 540-086.  Diastolic pressure: 76-19. Hypertension stage 2  Systolic pressure: 509 or above.  Diastolic pressure: 90 or above. How can this condition affect me? Managing your hypertension is an important responsibility. Over time, hypertension can damage the arteries and decrease blood flow to important parts of the body, including the brain, heart, and kidneys. Having untreated or uncontrolled hypertension can lead to:  A heart attack.  A stroke.  A weakened blood vessel (aneurysm).  Heart failure.  Kidney damage.  Eye damage.  Metabolic syndrome.  Memory and concentration problems.  Vascular dementia. What actions can I take to manage this condition? Hypertension can be managed by making lifestyle changes and possibly by taking medicines. Your health care provider will help you make a plan to bring your blood pressure within a normal range. Nutrition  Eat a diet that is high in fiber and potassium, and low in salt (sodium), added sugar, and fat. An example eating plan is called the Dietary Approaches to Stop Hypertension (DASH) diet. To eat this way: ? Eat plenty of fresh fruits and vegetables. Try to fill one-half of your plate at each meal with fruits and vegetables. ? Eat whole grains, such as whole-wheat pasta, brown rice, or whole-grain bread. Fill about one-fourth of your plate with whole grains. ? Eat low-fat dairy products. ? Avoid fatty cuts of meat, processed or cured meats, and poultry with skin. Fill about one-fourth of your plate with lean proteins such as fish, chicken without skin, beans, eggs, and tofu. ? Avoid pre-made and processed foods. These tend to be higher in sodium, added sugar, and fat.  Reduce your daily sodium intake. Most people with hypertension should eat less than 1,500 mg of sodium a day.   Lifestyle  Work with your health care provider to maintain a healthy body weight or to  lose weight. Ask what an ideal weight is for you.  Get at least 30 minutes of exercise that causes your heart to beat faster (aerobic exercise) most days of the week. Activities may include walking, swimming, or biking.  Include exercise to strengthen your muscles (resistance exercise), such as weight lifting, as part of your weekly exercise routine. Try to do these types of exercises for 30 minutes at least 3 days a week.  Do not use any products that contain nicotine or tobacco, such as cigarettes, e-cigarettes, and chewing tobacco. If you need help quitting, ask your health care provider.  Control any long-term (chronic) conditions you have, such as high cholesterol or diabetes.  Identify your sources of stress and find  ways to manage stress. This may include meditation, deep breathing, or making time for fun activities.   Alcohol use  Do not drink alcohol if: ? Your health care provider tells you not to drink. ? You are pregnant, may be pregnant, or are planning to become pregnant.  If you drink alcohol: ? Limit how much you use to:  0-1 drink a day for women.  0-2 drinks a day for men. ? Be aware of how much alcohol is in your drink. In the [place] one drink equals one 12 oz bottle of beer (355 mL), one 5 oz glass of wine (148 mL), or one 1 oz glass of hard liquor (44 mL). Medicines Your health care provider may prescribe medicine if lifestyle changes are not enough to get your blood pressure under control and if:  Your systolic blood pressure is 130 or higher.  Your diastolic blood pressure is 80 or higher. Take medicines only as told by your health care provider. Follow the directions carefully. Blood pressure medicines must be taken as told by your health care provider. The medicine does not work as well when you skip doses. Skipping doses also puts you at risk for problems. Monitoring Before you monitor your blood pressure:  Do not smoke, drink caffeinated beverages, or  exercise within 30 minutes before taking a measurement.  Use the bathroom and empty your bladder (urinate).  Sit quietly for at least 5 minutes before taking measurements. Monitor your blood pressure at home as told by your health care provider. To do this:  Sit with your back straight and supported.  Place your feet flat on the floor. Do not cross your legs.  Support your arm on a flat surface, such as a table. Make sure your upper arm is at heart level.  Each time you measure, take two or three readings one minute apart and record the results. You may also need to have your blood pressure checked regularly by your health care provider.   General information  Talk with your health care provider about your diet, exercise habits, and other lifestyle factors that may be contributing to hypertension.  Review all the medicines you take with your health care provider because there may be side effects or interactions.  Keep all visits as told by your health care provider. Your health care provider can help you create and adjust your plan for managing your high blood pressure. Where to find more information  National Heart, Lung, and Blood Institute: https://wilson-eaton.com/  American Heart Association: www.heart.org Contact a health care provider if:  You think you are having a reaction to medicines you have taken.  You have repeated (recurrent) headaches.  You feel dizzy.  You have swelling in your ankles.  You have trouble with your vision. Get help right away if:  You develop a severe headache or confusion.  You have unusual weakness or numbness, or you feel faint.  You have severe pain in your chest or abdomen.  You vomit repeatedly.  You have trouble breathing. These symptoms may represent a serious problem that is an emergency. Do not wait to see if the symptoms will go away. Get medical help right away. Call your local emergency services (911 in the [place] Do not drive  yourself to the hospital. Summary  Hypertension is when the force of blood pumping through your arteries is too strong. If this condition is not controlled, it may put you at risk for serious complications.  Your personal target blood  pressure may vary depending on your medical conditions, your age, and other factors. For most people, a normal blood pressure is less than 120/80.  Hypertension is managed by lifestyle changes, medicines, or both.  Lifestyle changes to help manage hypertension include losing weight, eating a healthy, low-sodium diet, exercising more, stopping smoking, and limiting alcohol. This information is not intended to replace advice given to you by your health care provider. Make sure you discuss any questions you have with your health care provider. Document Revised: 08/05/2019 Document Reviewed: 05/31/2019 Elsevier Patient Education  2021 [place].  Patellofemoral Pain Syndrome  Patellofemoral pain syndrome is a condition in which the tissue (cartilage) on the underside of the kneecap (patella) softens or breaks down. This causes pain in the front of the knee. The condition is also called runner's knee or chondromalacia patella. Patellofemoral pain syndrome is most common in young adults who are active in sports. The knee is the largest joint in the body. The patella covers the front of the knee and is attached to muscles above and below the knee. The underside of the patella is covered with a smooth type of cartilage (synovium). The smooth surface helps the patella glide easily when you move your knee. Patellofemoral pain syndrome causes swelling in the joint linings and bone surfaces in the knee. What are the causes? This condition may be caused by:  Overuse of the knee.  Poor alignment of your knee joints.  Weak leg muscles.  A direct hit to your kneecap. What increases the risk? You are more likely to develop this condition if:  You do a lot of  activities that can wear down your kneecap. These include: ? Running. ? Squatting. ? Climbing stairs.  You start a new physical activity or exercise program.  You wear shoes that do not fit well.  You do not have good leg strength.  You are overweight. What are the signs or symptoms? The main symptom of this condition is knee pain. This may feel like a dull, aching pain underneath your patella, in the front of your knee. There may be a popping or cracking sound when you move your knee. Pain may get worse with:  Exercise.  Climbing stairs.  Running.  Jumping.  Squatting.  Kneeling.  Sitting for a long time.  Moving or pushing on your patella. How is this diagnosed? This condition may be diagnosed based on:  Your symptoms and medical history. You may be asked about your recent physical activities and which ones cause knee pain.  A physical exam. This may include: ? Moving your patella back and forth. ? Checking your range of knee motion. ? Having you squat or jump to see if you have pain. ? Checking the strength of your leg muscles.  Imaging tests to confirm the diagnosis. These may include an MRI of your knee. How is this treated? This condition may be treated at home with rest, ice, compression, and elevation (RICE).  Other treatments may include:  NSAIDs, such as ibuprofen.  Physical therapy to stretch and strengthen your leg muscles.  Shoe inserts (orthotics) to take stress off your knee.  A knee brace or knee support.  Adhesive tapes to the skin.  Surgery to remove damaged cartilage or move the patella to a better position. This is rare. Follow these instructions at home: If you have a brace:  Wear the brace as told by your health care provider. Remove it only as told by your health care provider.  Loosen the brace if your toes tingle, become numb, or turn cold and blue.  Keep the brace clean.  If the brace is not waterproof: ? Do not let it get  wet. ? Cover it with a watertight covering when you take a bath or a shower. Managing pain, stiffness, and swelling  If directed, put ice on the painful area. To do this: ? If you have a removable brace, remove it as told by your health care provider. ? Put ice in a plastic bag. ? Place a towel between your skin and the bag. ? Leave the ice on for 20 minutes, 2-3 times a day. ? Remove the ice if your skin turns bright red. This is very important. If you cannot feel pain, heat, or cold, you have a greater risk of damage to the area.  Move your toes often to reduce stiffness and swelling.  Raise (elevate) the injured area above the level of your heart while you are sitting or lying down.   Activity  Rest your knee.  Avoid activities that cause knee pain.  Perform stretching and strengthening exercises as told by your health care provider or physical therapist.  Return to your normal activities as told by your health care provider. Ask your health care provider what activities are safe for you. General instructions  Take over-the-counter and prescription medicines only as told by your health care provider.  Use splints, braces, knee supports, or walking aids as directed by your health care provider.  Do not use any products that contain nicotine or tobacco, such as cigarettes, e-cigarettes, and chewing tobacco. These can delay healing. If you need help quitting, ask your health care provider.  Keep all follow-up visits. This is important. Contact a health care provider if:  Your symptoms get worse.  You are not improving with home care. Summary  Patellofemoral pain syndrome is a condition in which the tissue (cartilage) on the underside of the kneecap (patella) softens or breaks down.  This condition causes swelling in the joint linings and bone surfaces in the knee. This leads to pain in the front of the knee.  This condition may be treated at home with rest, ice,  compression, and elevation (RICE).  Use splints, braces, knee supports, or walking aids as directed by your health care provider. This information is not intended to replace advice given to you by your health care provider. Make sure you discuss any questions you have with your health care provider. Document Revised: 12/14/2019 Document Reviewed: 12/14/2019 Elsevier Patient Education  2021 [place] Allergy A seafood allergy is an abnormal reaction to fish or shellfish by the body's defense system (immune system). If you are allergic to seafood, your body reacts to fish or shellfish as if it is a dangerous substance. In some cases, a seafood allergy can cause a severe, life-threatening reaction that may make it hard to breathe (anaphylaxis). A shellfish allergy is one of the most common types of allergy. Shellfish includes crab, lobster, and shrimp. A shellfish allergy often does not start until the person is an adult. It is less common to have an allergy to finned fish, such as tuna, halibut, or salmon. Often, a fish allergy also does not start until the person is an adult. What are the causes? A seafood allergy happens when the immune system sees fish or shellfish as harmful and releases chemicals (antibodies) to fight it. What are the signs or symptoms? Symptoms of this condition include:  Itching or tingling in your mouth.  Coughing.  Nasal congestion.  Sneezing.  Nausea and vomiting.  Diarrhea.  Headaches. In people with a severe allergy, a life-threatening reaction can occur called anaphylaxis. Get help right away if you have symptoms of anaphylaxis, such as:  Feeling warm in the face (flushed). This may include redness.  Itchy, red, swollen areas of skin (hives).  Swelling of the eyes, lips, face, mouth, tongue, or throat.  Difficulty breathing, speaking, or swallowing.  Noisy breathing (wheezing).  Dizziness or light-headedness.  Fainting.  Pain or  cramping in the abdomen. How is this diagnosed? This condition may be diagnosed based on:  A physical exam.  Your medical history.  Blood tests.  A skin prick test. For this test, a small amount of a liquid containing an allergy-causing substance is put on your arm.  A food challenge test. This test involves eating the food that may be causing the allergic response while being monitored for a reaction by your health care provider. How is this treated? There is no cure for a seafood allergy. Treatment focuses on preventing exposure to the fish or shellfish you are allergic to and treating reactions if you are exposed to the food. Mild symptoms may not need treatment. Severe reactions usually need to be treated at a hospital. Treatment may include:  Medicines that help: ? Tighten your blood vessels (epinephrine). ? Relieve itching and hives (antihistamines). ? Widen the narrow and tight airways (bronchodilators). ? Reduce swelling (corticosteroids).  Oxygen therapy to help you breathe.  IV fluids to keep you hydrated. After a severe reaction, you may be given rescue medicines, such as:  An anaphylaxis kit.  An epinephrine injection, commonly called an auto-injector "pen" (pre-filled automatic epinephrine injection device). Your health care provider may teach you how to use these if you are accidentally exposed to an allergen.   Follow these instructions at home: Eating and drinking  Do not eat the shellfish or fish that you are allergic to.  Read food labels carefully. Fish and shellfish can be ingredients in sauces, broths, and other products.  When you eat out, let your server know that you have a seafood allergy. Ask how foods are prepared. General instructions  Take over-the-counter and prescription medicines only as told by your health care provider.  Wear a medical alert bracelet or necklace that describes your allergy.  Carry your anaphylaxis kit or an auto-injector  pen with you at all times. Use them as told by your health care provider.  Make sure that you, your family members, and your employer know: ? The signs of anaphylaxis. ? How to use an anaphylaxis kit. ? How to use an auto-injector pen.  If you think that you are having an anaphylactic reaction, use your auto-injector pen or anaphylaxis kit.  Replace your auto-injector pen immediately after use in case you have another reaction.  Get medical care after you use your auto-injector pen. This is important because you can have a delayed, life-threatening reaction after taking the medicine (rebound anaphylaxis).  Inform all health care providers that you have a seafood allergy.  Keep all follow-up visits as told by your health care provider. This is important. Contact a health care provider if you:  Have symptoms that do not go away within 2 days.  Have symptoms that get worse.  Have new symptoms. Get help right away if you have symptoms of anaphylaxis:  Flushed skin.  Hives.  Swelling of the eyes, lips, face, mouth,  tongue, or throat.  Difficulty breathing, speaking, or swallowing.  Wheezing.  Dizziness or light-headedness.  Fainting.  Pain or cramping in the abdomen. These symptoms may represent a serious problem that is an emergency. Do not wait to see if the symptoms will go away. Use your auto-injector pen or anaphylaxis kit as you have been told. Get medical help right away. Call your local emergency services (911 in the [place] Do not drive yourself to the hospital. If you needed to use an auto-injector pen, you need more medical care even if the medicine seems to be helping. This is important because anaphylaxis may happen again within 72 hours. Summary  A seafood allergy is when your body reacts to fish or shellfish as though they are dangerous substances.  In some cases, a fish or shellfish allergy can cause a severe, life-threatening reaction that may make it hard to  breathe (anaphylaxis).  There is no cure for food allergies. Treatment focuses on preventing exposure to the food or foods you are allergic to and treating reactions if you are exposed to the food.  Wear a medical alert bracelet or necklace that describes your allergy.  Make sure you understand your emergency treatment plan and know how to use the auto-injector pen. This information is not intended to replace advice given to you by your health care provider. Make sure you discuss any questions you have with your health care provider. Document Revised: 07/16/2017 Document Reviewed: 07/16/2017 Elsevier Patient Education  2021 [employer].  Hamstring Strain Rehab Ask your health care provider which exercises are safe for you. Do exercises exactly as told by your health care provider and adjust them as directed. It is normal to feel mild stretching, pulling, tightness, or discomfort as you do these exercises. Stop right away if you feel sudden pain or your pain gets worse. Do not begin these exercises until told by your health care provider. Stretching and range-of-motion exercises These exercises warm up your muscles and joints and improve the movement and flexibility of your thighs. These exercises also help to relieve pain, numbness, and tingling. Talk to your health care provider about these restrictions. Knee extension, seated 1. Sit with your left / right heel propped on a chair, a coffee table, or a footstool. Do not have anything under your knee to support it. 2. Allow your leg muscles to relax, letting gravity straighten out your knee (extension). You should feel a stretch behind your left / right knee. 3. If told by your health care provider, deepen the stretch by placing a __________ weight on your thigh, just above your kneecap. 4. Hold this position for __________ seconds. Repeat __________ times. Complete this exercise __________ times a day.   Seated stretch This exercise is  sometimes called hamstrings and adductors stretch. 1. Sit on the floor with your legs stretched wide. Keep your knees straight during this exercise. 2. Keeping your head and back in a straight line, bend at your waist to reach for your left foot (position A). You should feel a stretch in your right inner thigh (adductors). 3. Hold this position for __________ seconds. Then slowly return to the upright position. 4. Keeping your head and back in a straight line, bend at your waist to reach forward (position B). You should feel a stretch behind both of your thighs or knees (hamstrings). 5. Hold this position for __________ seconds. Then slowly return to the upright position. 6. Keeping your head and back in a straight line, bend  at your waist to reach for your right foot (position C). You should feel a stretch in your left inner thigh (adductors). 7. Hold this position for __________ seconds. Then slowly return to the upright position. Repeat __________ times. Complete this exercise __________ times a day.   Hamstrings stretch, supine 1. Lie on your back (supine position). 2. Loop a belt or towel over the ball of your left / right foot. The ball of your foot is on the walking surface, right under your toes. 3. Straighten your left / right knee and slowly pull on the belt or towel to raise your leg. ? Do not let your left / right knee bend while you do this. ? Keep your other leg flat on the floor. ? Raise the left / right leg until you feel a gentle stretch behind your left / right knee or thigh (hamstrings). 4. Hold this position for __________ seconds. 5. Slowly return your leg to the starting position. Repeat __________ times. Complete this exercise __________ times a day.   Strengthening exercises These exercises build strength and endurance in your thighs. Endurance is the ability to use your muscles for a long time, even after they get tired. Straight leg raises, prone This exercise  strengthens the muscles that move the hips (hip extensors). 1. Lie on your abdomen on a firm surface (prone position). 2. Tense the muscles in your buttocks and lift your left / right leg about 4 inches (10 cm). Keep your knee straight as you lift your leg. If you cannot lift your leg that high without arching your back, place a pillow under your hips. 3. Hold the position for __________ seconds. 4. Slowly lower your leg to the starting position. 5. Allow your muscles to relax completely before you start the next repetition. Repeat __________ times. Complete this exercise __________ times a day.   Bridge This exercise strengthens the muscles in your buttocks and the back of your thighs (hip extensors). 1. Lie on your back on a firm surface with your knees bent and your feet flat on the floor. 2. Tighten your buttocks muscles and lift your bottom off the floor until the trunk of your body is level with your thighs. ? You should feel the muscles working in your buttocks and the back of your thighs. ? Do not arch your back. 3. Hold this position for __________ seconds. 4. Slowly lower your hips to the starting position. 5. Let your buttocks muscles relax completely between repetitions. 6. If told by your health care provider, keep your bottom lifted off the floor while you slowly walk your feet away from you as far as you can control. Hold for __________ seconds, then slowly walk your feet back toward you. Repeat __________ times. Complete this exercise __________ times a day.   Lateral walking with band This is an exercise in which you walk sideways (lateral), with tension provided by an exercise band. The exercise strengthens the muscles in your hip (hip abductors). 1. Stand in a long hallway. 2. Wrap a loop of exercise band around your legs, just above your knees. 3. Bend your knees gently and drop your hips down and back so your weight is over your heels. 4. Step to the side to move down the  length of the hallway, keeping your toes pointed ahead of you and keeping tension in the band. 5. Repeat, leading with your other leg. Repeat __________ times. Complete this exercise __________ times a day. Single leg stand with  reaching This exercise is also called eccentric hamstring stretch. 1. Stand on your left / right foot. Keep your big toe down on the floor and try to keep your arch lifted. 2. Slowly reach down toward the floor as far as you can while keeping your balance. Lowering your thigh under tension is called eccentric stretching. 3. Hold this position for __________ seconds. Repeat __________ times. Complete this exercise __________ times a day. Plank, prone This exercise strengthens muscles in your abdomen and core area. 1. Lie on your abdomen on the floor (prone position),and prop yourself up on your elbows. Your hands should be straight out in front of you, and your elbows should be below your shoulders. Position your feet similar to a push-up position so your toes are on the ground. 2. Tighten your abdominal muscles and lift your body off the floor. ? Do not arch your back. ? Do not hold your breath. 3. Hold this position for __________ seconds. Repeat __________ times. Complete this exercise __________ times a day. This information is not intended to replace advice given to you by your health care provider. Make sure you discuss any questions you have with your health care provider. Document Revised: 10/21/2018 Document Reviewed: 06/28/2018 Elsevier Patient Education  2021 [employer].

## 2020-08-09 ENCOUNTER — Telehealth: Payer: Self-pay | Admitting: Family Medicine

## 2020-08-09 NOTE — Telephone Encounter (Signed)
The patient just wanted to Thank Dr. Salomon Fick for sending her the discount Rx card for her Epipen but she opened it a day to late.  Just a Thank you

## 2020-08-21 ENCOUNTER — Encounter: Payer: Self-pay | Admitting: Family Medicine

## 2020-10-11 ENCOUNTER — Encounter: Payer: Self-pay | Admitting: Allergy

## 2020-10-11 ENCOUNTER — Ambulatory Visit (INDEPENDENT_AMBULATORY_CARE_PROVIDER_SITE_OTHER): Payer: 59 | Admitting: Allergy

## 2020-10-11 ENCOUNTER — Other Ambulatory Visit: Payer: Self-pay

## 2020-10-11 VITALS — BP 122/72 | HR 67 | Temp 97.6°F | Resp 17 | Ht 63.0 in | Wt 137.8 lb

## 2020-10-11 DIAGNOSIS — L239 Allergic contact dermatitis, unspecified cause: Secondary | ICD-10-CM | POA: Diagnosis not present

## 2020-10-11 DIAGNOSIS — R110 Nausea: Secondary | ICD-10-CM | POA: Diagnosis not present

## 2020-10-11 DIAGNOSIS — R229 Localized swelling, mass and lump, unspecified: Secondary | ICD-10-CM

## 2020-10-11 DIAGNOSIS — T781XXD Other adverse food reactions, not elsewhere classified, subsequent encounter: Secondary | ICD-10-CM

## 2020-10-11 DIAGNOSIS — T50905A Adverse effect of unspecified drugs, medicaments and biological substances, initial encounter: Secondary | ICD-10-CM

## 2020-10-11 DIAGNOSIS — T781XXA Other adverse food reactions, not elsewhere classified, initial encounter: Secondary | ICD-10-CM | POA: Diagnosis not present

## 2020-10-11 DIAGNOSIS — T783XXA Angioneurotic edema, initial encounter: Secondary | ICD-10-CM

## 2020-10-11 DIAGNOSIS — T783XXD Angioneurotic edema, subsequent encounter: Secondary | ICD-10-CM

## 2020-10-11 DIAGNOSIS — T7819XD Other adverse food reactions, not elsewhere classified, subsequent encounter: Secondary | ICD-10-CM

## 2020-10-11 DIAGNOSIS — T50B95A Adverse effect of other viral vaccines, initial encounter: Secondary | ICD-10-CM

## 2020-10-11 NOTE — Progress Notes (Signed)
New Patient Note  RE: Allison Hoffman MRN: [id number] DOB: 06/23/1971 Date of Office Visit: 10/11/2020  Referring provider: Deeann Saint, MD Primary care provider: Deeann Saint, MD  Chief Complaint: reaction to vaccine  History of present illness: Allison Hoffman is a 50 y.o. female presenting today for consultation for adverse reaction to COVID-19 vaccine.   She reports having a reaction to the second Pfizer vaccine in April 2021.  She reports shortly after the shot about 15-30 minutes  she started to feel itchy on her legs and arms.  She then noted lip swelling and around her mouth.  She went to ED and states didn't get back to a room until about 4-5 hours later.  She states her symptoms had improved slightly.  She states she was offered prednisone in the ED but declined.  She reports by the next day symptoms had resolved.  The first pfizer she reports she some nausea.   Weeks later (about a month) after [employer] vaccine she states she went out to eat and ate fish.  She reports having lip swelling. She states she has had several different episodes of eating fish out and developing lip swelling shortly after.  She has noted it occurred with salmon and fried flounder.  She states when she has cooked salmon at home without issue.  She does not state that the family she cooks at home was cooked a lot longer than the salmon she is eating out. She reports she eats tuna at work for lunch often without issue.  She reports lip swelling after eating papaya.  She does have a epipen.   She also has noted facial swelling when she is very stressed reports her work can be very stressful.    She also reports certain foods make her develop upset stomach/stomah pain for about 30 minutes and then it resolves.  She also can note nausea.  This occurs with avocado, watermelon and peanuts.  She states bananas use to do but she would eat small amounts of it until she could tolerate whole banana.  She is  not sure if she has an issue with latex as she has tolerated contact exposure in the past.  She can eat cashews, pecans and walnuts without issue.    She reports using Nivea that had almond oil and first time use was fine the second time use she reports developed itching.    No history of asthma, eczema or allergic rhinitis or conjunctivitis.  Review of systems the past 4 weeks: Review of Systems  Constitutional: Negative.   HENT: Negative.   Eyes: Negative.   Respiratory: Negative.   Cardiovascular: Negative.   Gastrointestinal: Negative.   Musculoskeletal: Negative.   Skin: Negative.   Neurological: Negative.     All other systems negative unless noted above in HPI  Past medical history: Past Medical History:  Diagnosis Date  . Chicken pox   . Depression   . Hypertension   . 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 Screen for STD (sexually transmitted disease)     Past surgical history: Past Surgical History:  Procedure Laterality Date  . BREAST BIOPSY  1997  . OOPHORECTOMY      Family history:  Family History  Problem Relation Age of Onset  . Cancer Mother   . Hypertension Mother   . Mental illness Mother   . Kidney disease Father     Social history: Lives in an apartment with carpeting with electric  heating and central cooling.  No pets in the home.  There is no concern for water damage, mildew or roaches in the home.  She works at [employer] at [employer].  Medication List: Current Outpatient Medications  Medication Sig Dispense Refill  . EPINEPHrine 0.3 mg/0.3 mL IJ SOAJ injection Inject 0.3 mg into the muscle as needed for anaphylaxis. 2 each 2  . hydrochlorothiazide (HYDRODIURIL) 12.5 MG tablet TK 1 T PO QAM 30 tablet 1  . Norgestimate-Ethinyl Estradiol Triphasic 0.18/0.215/0.25 MG-35 MCG tablet Take 1 tablet by mouth daily.     No current facility-administered medications for this visit.    Known medication allergies: Allergies  Allergen Reactions  . Amlodipine     Swelling  unable to breathe   . Codeine   . Tylenol With Codeine #3 [Acetaminophen-Codeine] Hives     Physical examination: Blood pressure 122/72, pulse 67, temperature 97.6 F (36.4 C), temperature source Temporal, resp. rate 17, height 5\' 3"  (1.6 m), weight 137 lb 12.8 oz (62.5 kg), SpO2 97 %.  General: Alert, interactive, in no acute distress. HEENT: PERRLA, TMs pearly gray, turbinates non-edematous without discharge, post-pharynx non erythematous. Neck: Supple without lymphadenopathy. Lungs: Clear to auscultation without wheezing, rhonchi or rales. {no increased work of breathing. CV: Normal S1, S2 without murmurs. Abdomen: Nondistended, nontender. Skin: Warm and dry, without lesions or rashes. Extremities:  No clubbing, cyanosis or edema. Neuro:   Grossly intact.  Diagnositics/Labs:  Allergy testing: Select food allergy skin prick testing is positive to pistachio, Bass, Trout and milder.  Negative to peanut, fish mix, almond, hazelnut, nut, Fish, salmon, avocado and watermelon Allergy testing results were read and interpreted by provider, documented by clinical staff.   Assessment and plan: Angioedema Adverse effect of Covid 19 vaccine Adverse reaction  - after 2nd Pfizer vaccine - discussed Covid vaccine testing today with skin prick testing to Pfizer and Moderna vaccine followed by intradermal testing is prick test is negative.  If skin testing is negative then next recommended step is graded administration of Covid vaccine - if you would like to have option to receive future booster doses then recommend skin testing  - skin testing to foods today is positive to pistachio, bass, trout and flounder.  Negative to peanut, almond, hazelnut, [place] nut, catfish, salmon, codfish, avocado, watermelon - will obtain serum IgE to the negative foods  - continue avoidance of fish (can keep tuna and home prepared salmon in diet)  - have access to self-injectable epinephrine (Epipen or  AuviQ) 0.3mg  at all times - follow emergency action plan in case of allergic reaction  - will obtain lab for hereditary angioedema panel that is an issue that can result in recurrent swelling episodes that is not allergen or histamine driven.  This is a very different treatment plan than allergen/histamine driven swelling issues.    Follow-up in 4-6 months or sooner if needed (or if wanting Covid vaccine testing) I appreciate the opportunity to take part in Allison Hoffman's care. Please do not hesitate to contact me with questions.  Sincerely,   [place], MD Allergy/Immunology [facility]

## 2020-10-11 NOTE — Patient Instructions (Addendum)
Swelling - after 2nd Pfizer vaccine - discussed Covid vaccine testing today with skin prick testing to Pfizer and Moderna vaccine followed by intradermal testing is prick test is negative.  If skin testing is negative then next recommended step is graded administration of Covid vaccine - if you would like to have option to receive future booster doses then recommend skin testing  - skin testing to foods today is positive to pistachio, bass, trout and flounder.  Negative to peanut, almond, hazelnut, [place] nut, catfish, salmon, codfish, avocado, watermelon - will obtain serum IgE to the negative foods  - continue avoidance of fish (can keep tuna and home prepared salmon in diet)  - have access to self-injectable epinephrine (Epipen or AuviQ) 0.3mg  at all times - follow emergency action plan in case of allergic reaction  - will obtain lab for hereditary angioedema panel that is an issue that can result in recurrent swelling episodes that is not allergen or histamine driven.  This is a very different treatment plan than allergen/histamine driven swelling issues.    Follow-up in 4-6 months or sooner if needed (or if wanting Covid vaccine testing)0

## 2020-10-12 LAB — C1 ESTERASE INHIBITOR, FUNCTIONAL

## 2020-10-13 LAB — ALLERGEN WATERMELON

## 2020-10-13 LAB — ALLERGENS(7)

## 2020-10-15 LAB — ALLERGEN PROFILE, FOOD-FISH

## 2020-10-16 LAB — COMPLEMENT COMPONENT C1Q

## 2020-10-16 LAB — ALLERGEN PROFILE, FOOD-FISH
Allergen Trout IgE: <0.1 kU/L
Allergen Walley Pike IgE: <0.1 kU/L

## 2020-10-17 LAB — ALLERGENS(7)
Brazil Nut IgE: <0.1 kU/L
F020-IgE Almond: <0.1 kU/L
F202-IgE Cashew Nut: <0.1 kU/L
Hazelnut (Filbert) IgE: <0.1 kU/L
Peanut IgE: <0.1 kU/L
Walnut IgE: <0.1 kU/L

## 2020-10-17 LAB — F293-IGE PAPAYA FOOD: F293-IgE Papaya Food: <0.1 kU/L

## 2020-10-17 LAB — C4 COMPLEMENT: Complement C4, Serum: 26 mg/dL (ref 12–38)

## 2020-10-17 LAB — ALLERGEN PROFILE, FOOD-FISH
Allergen Mackerel IgE: <0.1 kU/L
Codfish IgE: <0.1 kU/L
Halibut IgE: <0.1 kU/L
Tuna: <0.1 kU/L

## 2020-10-17 LAB — ALLERGEN AVOCADO F96: F096-IgE Avocado: 0.14 kU/L — AB

## 2020-10-17 LAB — C1 ESTERASE INHIBITOR: C1INH SerPl-mCnc: 28 mg/dL (ref 21–39)

## 2020-10-17 LAB — TRYPTASE: Tryptase: 3.3 ug/L (ref 2.2–13.2)

## 2020-11-12 ENCOUNTER — Encounter: Payer: Self-pay | Admitting: Family Medicine

## 2020-12-22 IMAGING — DX DG CHEST 2V
2 series · 2 of 2 positions shown · non-contrast
Comparison: 06/21/2018

CLINICAL DATA: Cough, congestion

EXAM:
CHEST - 2 VIEW

[chest pa]
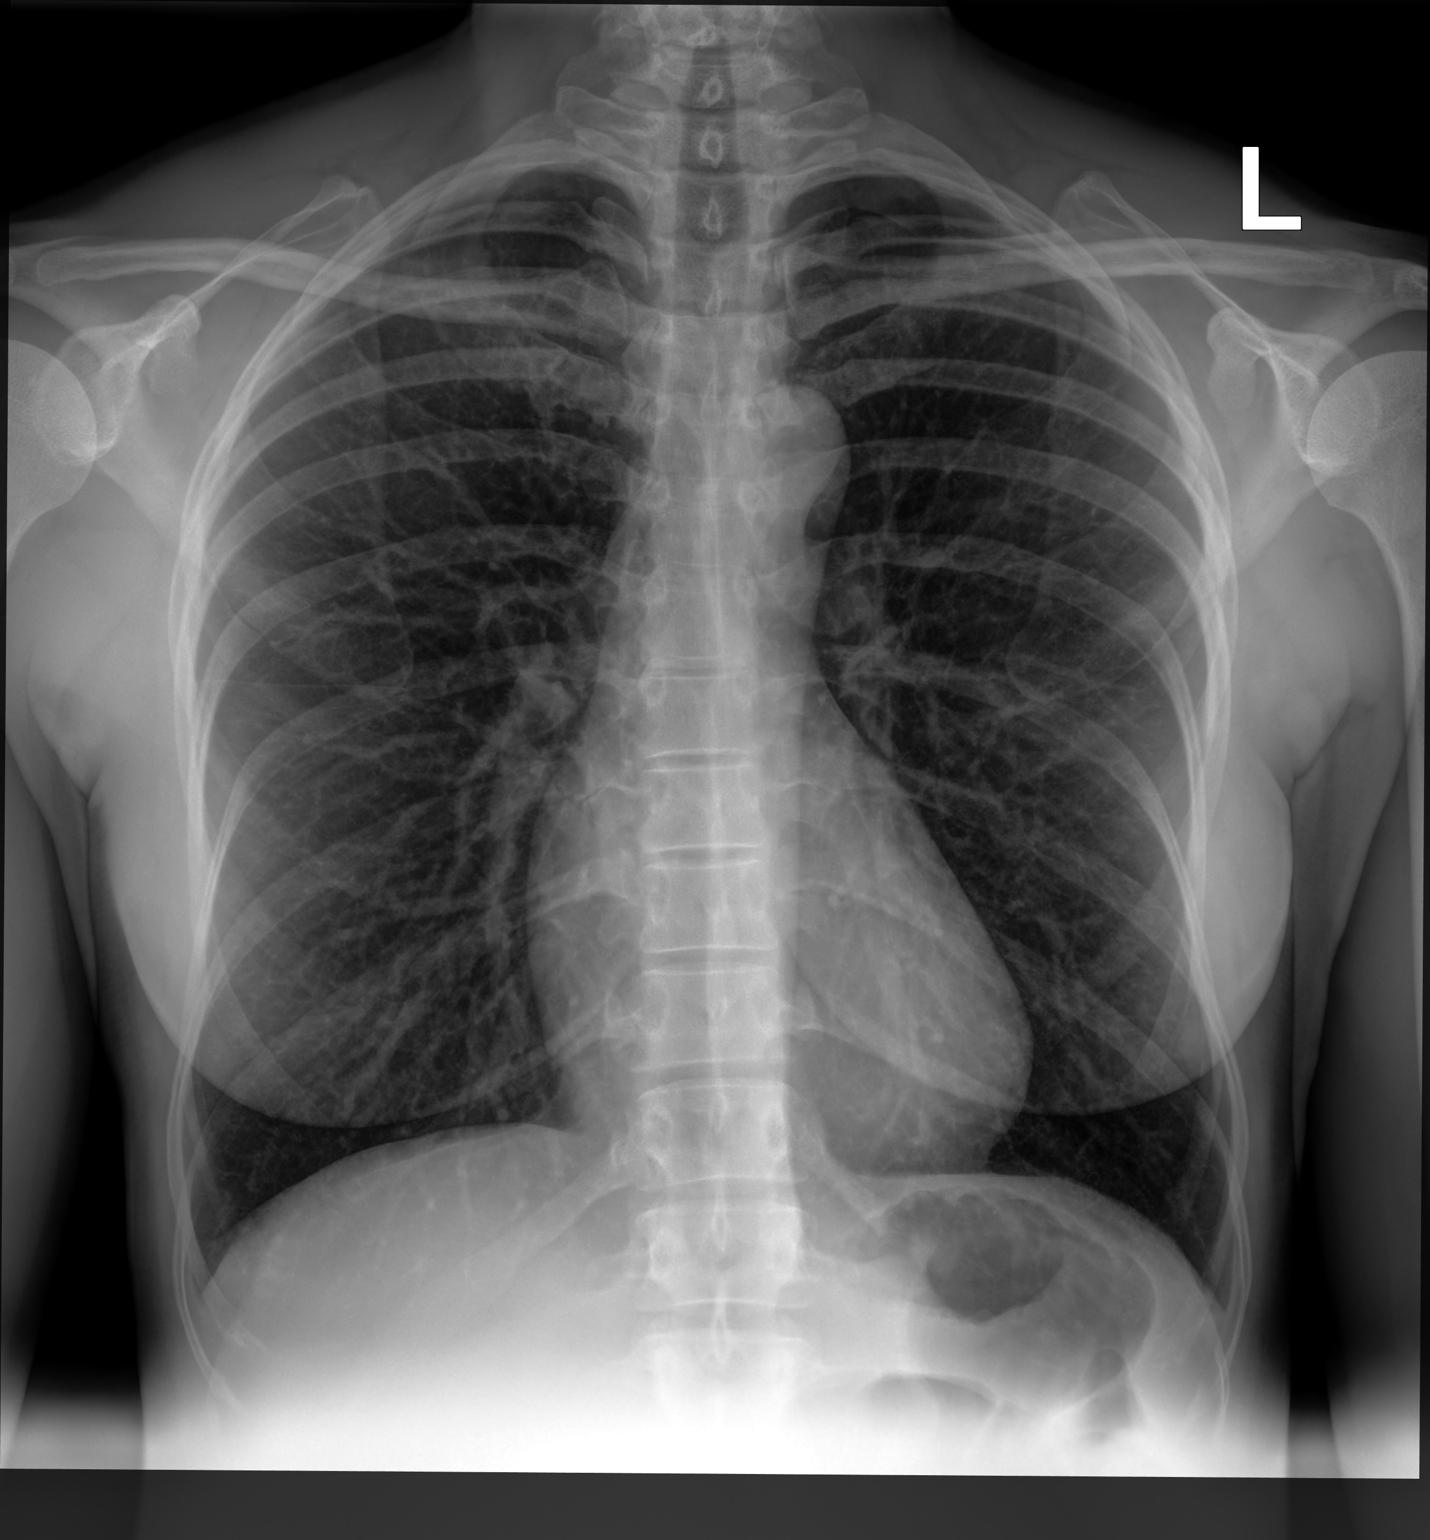

[chest lat]
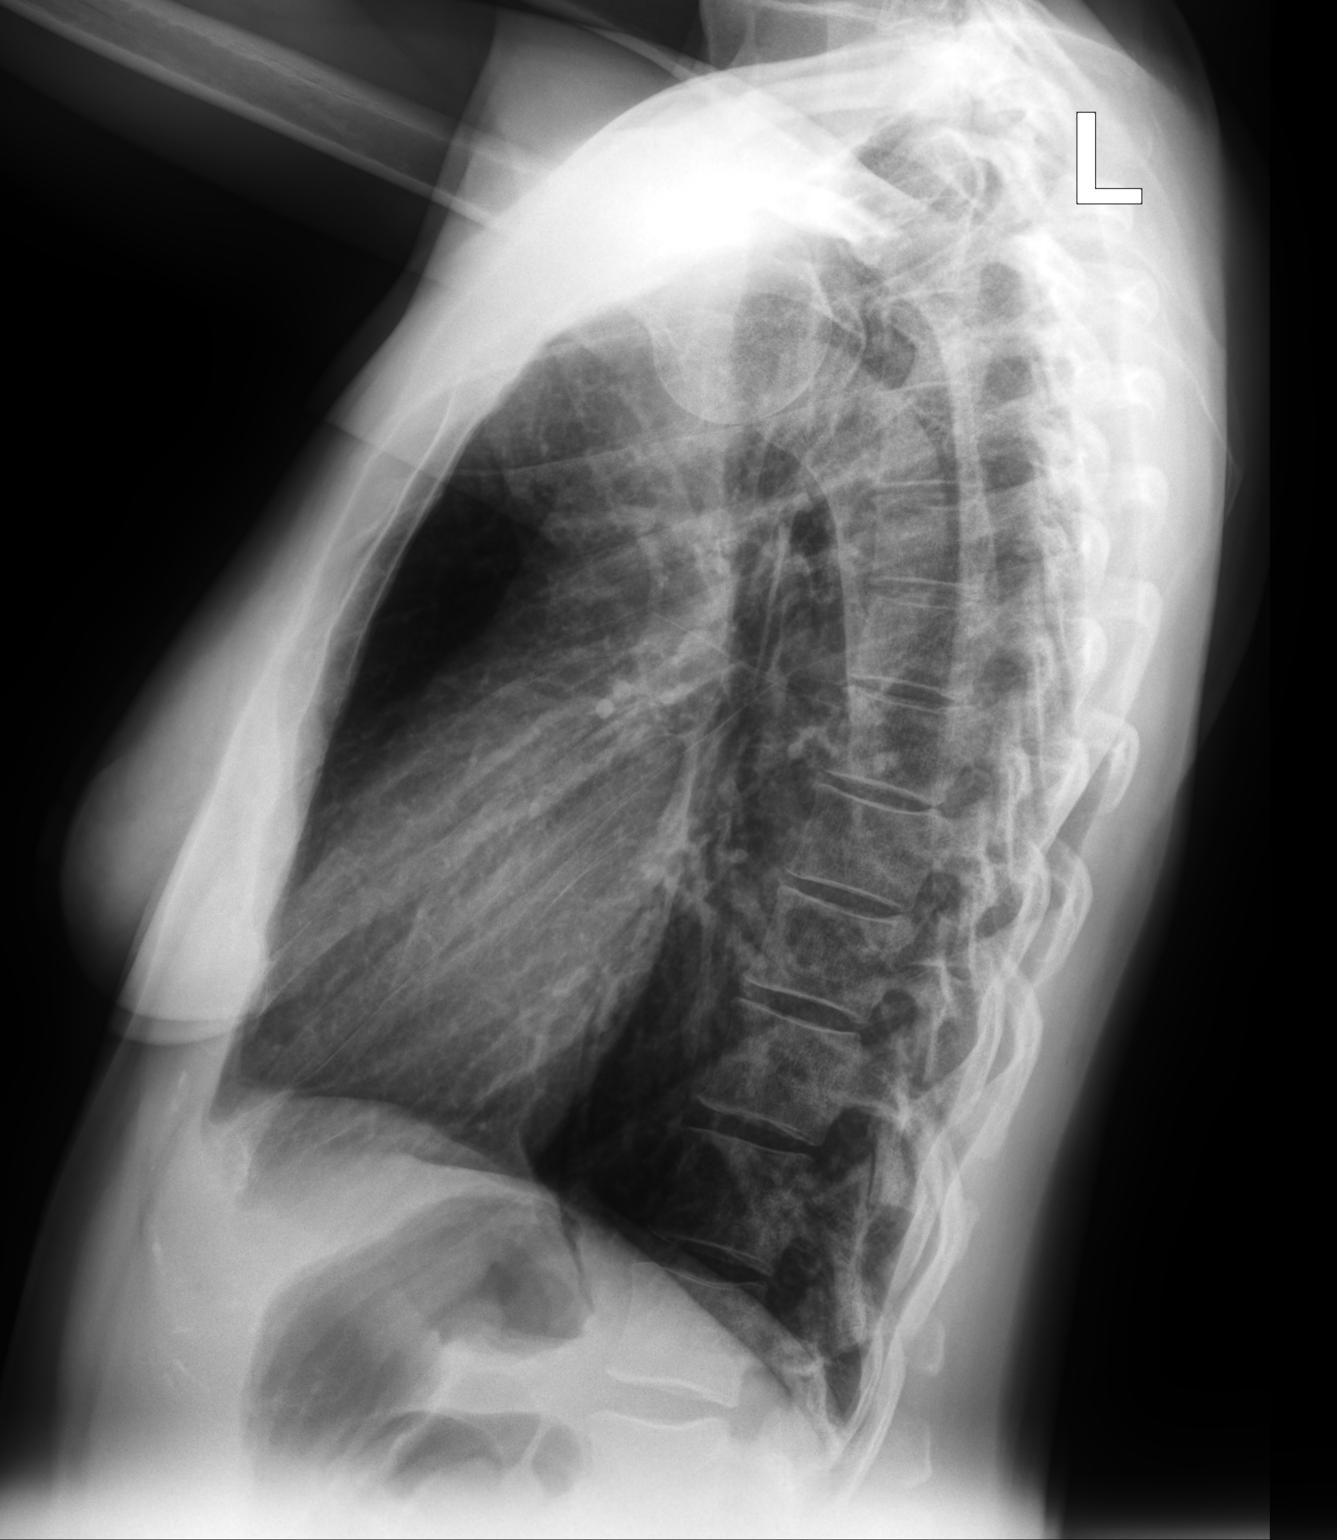

[2 of 2 positions shown; findings below may reference images not displayed]

FINDINGS: Previously seen right upper lobe infiltrate has resolved. Lungs
clear. Heart is normal size. No effusions or acute bony abnormality.
IMPRESSION: No active cardiopulmonary disease.

## 2021-01-01 ENCOUNTER — Other Ambulatory Visit: Payer: Self-pay

## 2021-01-02 ENCOUNTER — Ambulatory Visit (INDEPENDENT_AMBULATORY_CARE_PROVIDER_SITE_OTHER): Payer: 59 | Admitting: Family Medicine

## 2021-01-02 ENCOUNTER — Encounter: Payer: Self-pay | Admitting: Family Medicine

## 2021-01-02 VITALS — BP 128/82 | HR 72 | Temp 98.1°F | Wt 128.6 lb

## 2021-01-02 DIAGNOSIS — R202 Paresthesia of skin: Secondary | ICD-10-CM | POA: Diagnosis not present

## 2021-01-02 DIAGNOSIS — M25531 Pain in right wrist: Secondary | ICD-10-CM | POA: Diagnosis not present

## 2021-01-02 DIAGNOSIS — G5601 Carpal tunnel syndrome, right upper limb: Secondary | ICD-10-CM | POA: Diagnosis not present

## 2021-01-02 LAB — FOLATE: Folate: >24.4 ng/mL (ref >5.9)

## 2021-01-02 LAB — CBC WITH DIFFERENTIAL/PLATELET
Basophils Absolute: 0 10*3/uL (ref 0.0–0.1)
Basophils Relative: 0.5 % (ref 0.0–3.0)
Eosinophils Absolute: 0.1 10*3/uL (ref 0.0–0.7)
Eosinophils Relative: 1.9 % (ref 0.0–5.0)
HCT: 37 % (ref 36.0–46.0)
Hemoglobin: 12.8 g/dL (ref 12.0–15.0)
Lymphocytes Relative: 38.2 % (ref 12.0–46.0)
Lymphs Abs: 1.9 10*3/uL (ref 0.7–4.0)
MCHC: 34.7 g/dL (ref 30.0–36.0)
MCV: 95.8 fl (ref 78.0–100.0)
Monocytes Absolute: 0.5 10*3/uL (ref 0.1–1.0)
Monocytes Relative: 10.9 % (ref 3.0–12.0)
Neutro Abs: 2.4 10*3/uL (ref 1.4–7.7)
Neutrophils Relative %: 48.5 % (ref 43.0–77.0)
Platelets: 244 10*3/uL (ref 150.0–400.0)
RBC: 3.86 Mil/uL — ABNORMAL LOW (ref 3.87–5.11)
RDW: 13.2 % (ref 11.5–15.5)
WBC: 4.9 10*3/uL (ref 4.0–10.5)

## 2021-01-02 LAB — COMPREHENSIVE METABOLIC PANEL
ALT: 32 U/L (ref 0–35)
AST: 26 U/L (ref 0–37)
Albumin: 4.5 g/dL (ref 3.5–5.2)
Alkaline Phosphatase: 53 U/L (ref 39–117)
BUN: 15 mg/dL (ref 6–23)
CO2: 29 mEq/L (ref 19–32)
Calcium: 10.1 mg/dL (ref 8.4–10.5)
Chloride: 99 mEq/L (ref 96–112)
Creatinine, Ser: 0.75 mg/dL (ref 0.40–1.20)
GFR: 92.6 mL/min (ref >60.00)
Glucose, Bld: 86 mg/dL (ref 70–99)
Potassium: 3.7 mEq/L (ref 3.5–5.1)
Sodium: 138 mEq/L (ref 135–145)
Total Bilirubin: 0.5 mg/dL (ref 0.2–1.2)
Total Protein: 7.8 g/dL (ref 6.0–8.3)

## 2021-01-02 LAB — VITAMIN B12: Vitamin B-12: 514 pg/mL (ref 211–911)

## 2021-01-02 LAB — TSH: TSH: 2.07 u[IU]/mL (ref 0.35–4.50)

## 2021-01-02 LAB — HEMOGLOBIN A1C: Hgb A1c MFr Bld: 5.9 % (ref 4.6–6.5)

## 2021-01-02 LAB — T4, FREE: Free T4: 0.94 ng/dL (ref 0.60–1.60)

## 2021-01-02 MED ORDER — PREDNISONE 10 MG PO TABS: ORAL_TABLET | ORAL | 0 refills | Status: DC

## 2021-01-02 NOTE — Patient Instructions (Signed)
You can try vitamin B6 1000 IUs daily to help with your breast symptoms. You can also obtain a wrist splint from your local drugstore or pharmacy that you can wear at night and during the day if needed. For continued or worsening symptoms consider nerve conduction studies with neurology.

## 2021-01-02 NOTE — Progress Notes (Signed)
Subjective:    Patient ID: Allison Hoffman, female    DOB: May 18, 1971, 50 y.o.   MRN: [id number]  Chief Complaint  Patient presents with   Numbness    Certain fingers started about 2 wks ago.constant numb, started from elbow down, now is hand, wrist and fingers of rt hand.    HPI Patient was seen today for acute concern.  Patient endorses numbness in right arm that started 2 wks ago.  Initially with numbness/pain from right elbow into hand that woke her up at night.  Now with numbness in R middle and half of 4th digit.   Also with edema in R wrist and hand.  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  While on leave was typing less, but used her hands/wrist to help pull her mother up.  Tried ibuprofen without relief.  Made an appt with a clinic her coworker mentioned for steroid injection.  Patient is right-handed.  Past Medical History:  Diagnosis Date   Chicken pox    Depression    Hypertension    Positive TB test    Screen for STD (sexually transmitted disease)     Allergies  Allergen Reactions   Amlodipine     Swelling unable to breathe    Codeine    Tylenol With Codeine #3 [Acetaminophen-Codeine] Hives    ROS General: Denies fever, chills, night sweats, changes in weight, changes in appetite HEENT: Denies headaches, ear pain, changes in vision, rhinorrhea, sore throat CV: Denies CP, palpitations, SOB, orthopnea Pulm: Denies SOB, cough, wheezing GI: Denies abdominal pain, nausea, vomiting, diarrhea, constipation GU: Denies dysuria, hematuria, frequency, vaginal discharge Msk: Denies muscle cramps, joint pains Neuro: Denies weakness, numbness, tingling  + numbness and tingling in right wrist, hand and fingers Skin: Denies rashes, bruising Psych: Denies depression, anxiety, hallucinations     Objective:    Blood pressure 128/82, pulse 72, temperature 98.1 F (36.7 C), temperature source Oral, weight 128 lb 9.6 oz  (58.3 kg), SpO2 98 %.  Gen. Pleasant, well-nourished, in no distress, normal affect   HEENT: [employer]/AT, face symmetric, conjunctiva clear, no scleral icterus, PERRLA, EOMI, nares patent without drainage Lungs: no accessory muscle use Cardiovascular: RRR, no m/r/g, no peripheral edema Abdomen: BS present, soft, NT/ND, no hepatosplenomegaly. Musculoskeletal: Negative Tinel bilaterally.  Positive Phalen's on right.  Edema of right anterior wrist.  No numbness or tingling with palpation of medial and lateral epicondyles.  Grip strength on right 4/5.  Grip strength on left 5/5.  No deformities, no cyanosis or clubbing, normal tone Neuro:  A&Ox3, CN II-XII intact, normal gait Skin:  Warm, no lesions/ rash   Wt Readings from Last 3 Encounters:  01/02/21 128 lb 9.6 oz (58.3 kg)  10/11/20 137 lb 12.8 oz (62.5 kg)  08/03/20 136 lb (61.7 kg)    Lab Results  Component Value Date   WBC 5.4 08/03/2020   HGB 12.0 08/03/2020   HCT 34.8 (L) 08/03/2020   PLT 266.0 08/03/2020   GLUCOSE 76 08/03/2020   CHOL 208 (H) 08/03/2020   TRIG 110.0 08/03/2020   HDL 71.30 08/03/2020   LDLCALC 114 (H) 08/03/2020   NA 134 (L) 08/03/2020   K 4.0 08/03/2020   CL 98 08/03/2020   CREATININE 0.81 08/03/2020   BUN 11 08/03/2020   CO2 27 08/03/2020   TSH 1.18 06/16/2019   HGBA1C 5.6 08/03/2020    Assessment/Plan:  Paresthesia  -  Plan: Vitamin B12, TSH, T4, Free, CBC with Differential/Platelet, Folate, CMP, Hemoglobin A1c  Right wrist pain  -Likely 2/2 median nerve compression from carpal tunnel syndrome - Plan: predniSONE (DELTASONE) 10 MG tablet  Carpal tunnel syndrome of right wrist  -Discussed formal testing such as EMG/NCS -Supportive care including ice, heat, stretching, wrist splint, topical analgesics, NSAIDs -Discussed other treatment options including prednisone taper, steroid injection, carpal tunnel release. -will start prednisone taper -Patient to make ergonomic changes to her workspace and use  wrist splint at night. - Plan: predniSONE (DELTASONE) 10 MG tablet  F/u as needed  Abbe [place], MD

## 2021-02-12 ENCOUNTER — Encounter: Payer: Self-pay | Admitting: Family Medicine

## 2021-07-02 ENCOUNTER — Telehealth: Payer: Self-pay | Admitting: Family Medicine

## 2021-07-02 NOTE — Telephone Encounter (Signed)
Patient called in to see when her physical was last Dec... I informed patient that it was actually in January of 2022.Allison Hoffman So patient stated that she wanted to come in soon to have Provider check  her thyroid.. I  got patient scheduled  Patient did not say anything in regards to physical after scheduling the office visit

## 2021-07-04 ENCOUNTER — Ambulatory Visit (INDEPENDENT_AMBULATORY_CARE_PROVIDER_SITE_OTHER): Payer: 59 | Admitting: Family Medicine

## 2021-07-04 VITALS — BP 142/82 | HR 80 | Temp 98.3°F | Wt 134.6 lb

## 2021-07-04 DIAGNOSIS — G4700 Insomnia, unspecified: Secondary | ICD-10-CM | POA: Diagnosis not present

## 2021-07-04 DIAGNOSIS — Z780 Asymptomatic menopausal state: Secondary | ICD-10-CM | POA: Diagnosis not present

## 2021-07-04 LAB — T4, FREE: Free T4: 0.95 ng/dL (ref 0.60–1.60)

## 2021-07-04 LAB — TSH: TSH: 1.39 u[IU]/mL (ref 0.35–5.50)

## 2021-07-04 NOTE — Progress Notes (Signed)
Subjective:    Patient ID: Allison Hoffman, female    DOB: 1970/12/10, 50 y.o.   MRN: [id number]  Chief Complaint  Patient presents with   Thyroid Problem    Wants to discuss and talk about it    Menopause    HPI Patient was seen today for f/u.  Pt endorses increased menopause symptoms including hot flashes making it difficult to sleep.  Pt seen by OB/Gyn, started on HRT.  States bp is elevated 2/2 not sleeping.  Taking HCTZ 12.5 mg daily.  Given Ambien 10 mg by OB/Gyn.  Pt states the med works if she is able to fall asleep right away.  If not she is up all night.  Mind often races.  Pt endorses some anxiety, but does not feel it is contributing.  Pt feels her thyroid needs to be checked as it is causing her difficulty sleeping.  TSH and free T4 normal on 01/02/21.  Past Medical History:  Diagnosis Date   Chicken pox    Depression    Hypertension    Positive TB test    Screen for STD (sexually transmitted disease)     Allergies  Allergen Reactions   Amlodipine     Swelling unable to breathe    Codeine    Tylenol With Codeine #3 [Acetaminophen-Codeine] Hives    ROS General: Denies fever, chills, night sweats, changes in weight, changes in appetite  +hot flashes HEENT: Denies headaches, ear pain, changes in vision, rhinorrhea, sore throat CV: Denies CP, palpitations, SOB, orthopnea Pulm: Denies SOB, cough, wheezing GI: Denies abdominal pain, nausea, vomiting, diarrhea, constipation GU: Denies dysuria, hematuria, frequency, vaginal discharge Msk: Denies muscle cramps, joint pains Neuro: Denies weakness, numbness, tingling Skin: Denies rashes, bruising Psych: Denies depression, hallucinations  +anxiety, insomnia    Objective:    Blood pressure (!) 142/82, pulse 80, temperature 98.3 F (36.8 C), temperature source Oral, weight 134 lb 9.6 oz (61.1 kg), SpO2 99 %.  Gen. Pleasant, well-nourished, in no distress, normal affect   HEENT: [place]/AT, face symmetric, conjunctiva clear, no  scleral icterus, PERRLA, EOMI, nares patent without drainage. Lungs: no accessory muscle use, CTAB, no wheezes or rales Cardiovascular: RRR, no m/r/g, no peripheral edema Neuro:  A&Ox3, CN II-XII intact, normal gait Skin:  Warm, no lesions/ rash   Wt Readings from Last 3 Encounters:  07/04/21 134 lb 9.6 oz (61.1 kg)  01/02/21 128 lb 9.6 oz (58.3 kg)  10/11/20 137 lb 12.8 oz (62.5 kg)    Lab Results  Component Value Date   WBC 4.9 01/02/2021   HGB 12.8 01/02/2021   HCT 37.0 01/02/2021   PLT 244.0 01/02/2021   GLUCOSE 86 01/02/2021   CHOL 208 (H) 08/03/2020   TRIG 110.0 08/03/2020   HDL 71.30 08/03/2020   LDLCALC 114 (H) 08/03/2020   ALT 32 01/02/2021   AST 26 01/02/2021   NA 138 01/02/2021   K 3.7 01/02/2021   CL 99 01/02/2021   CREATININE 0.75 01/02/2021   BUN 15 01/02/2021   CO2 29 01/02/2021   TSH 2.07 01/02/2021   HGBA1C 5.9 01/02/2021   Depression screen PHQ 2/9 07/04/2021  Decreased Interest 1  Down, Depressed, Hopeless 2  PHQ - 2 Score 3  Altered sleeping 3  Tired, decreased energy 3  Change in appetite 0  Trouble concentrating 1  Moving slowly or fidgety/restless 1  PHQ-9 Score 11    Assessment/Plan:  Insomnia, unspecified type -Discussed likely multifactorial.  Hot flashes 2/2 menopause contributing  as well as anxiety. -Advised less likely 2/2 thyroid dysfunction given previously normal TSH and free t4, 2.07 and 0.94 on 01/02/21. -PHQ 9 score 11 -obtain GAD-7 -Sleep hygiene -Pt advised to consider counseling given increased anxiety.  Given info for area [facility] providers. -continue Ambien as rx'd by OB/Gyn -Plan: TSH, T4, free  Menopause -symptomatic treatment -continue HRT as rx'd by OB/Gyn:  Provera 2.5 mg daily, estrace 0.5mg  BID  F/u prn  Grier Mitts, MD

## 2021-07-04 NOTE — Patient Instructions (Addendum)
Your thyroid was normal when last checked on 01/02/2021.  Behavioral Health Services: -to make an appointment contact the office/provider you are interested in seeing.  No referral is needed.  The below is not an all inclusive list, but will help you get started.  [email or web address] -counseling located off of [address]  [email or web address] -website helps you find providers in your area  [facility] -Located off of [address]. across from [place]  Dr. Jannifer Franklin is a Therapist, sports with [facility]. [phone]  [facility]  [facility]  -[address]  [phone] -a place in town that has counseling and Psychiatry services.

## 2021-07-21 ENCOUNTER — Encounter: Payer: Self-pay | Admitting: Family Medicine

## 2021-08-15 ENCOUNTER — Ambulatory Visit (INDEPENDENT_AMBULATORY_CARE_PROVIDER_SITE_OTHER): Payer: 59 | Admitting: Family Medicine

## 2021-08-15 ENCOUNTER — Encounter: Payer: Self-pay | Admitting: Family Medicine

## 2021-08-15 VITALS — BP 130/80 | HR 72 | Temp 97.6°F | Wt 136.2 lb

## 2021-08-15 DIAGNOSIS — Z1211 Encounter for screening for malignant neoplasm of colon: Secondary | ICD-10-CM

## 2021-08-15 DIAGNOSIS — E782 Mixed hyperlipidemia: Secondary | ICD-10-CM | POA: Diagnosis not present

## 2021-08-15 DIAGNOSIS — G4700 Insomnia, unspecified: Secondary | ICD-10-CM

## 2021-08-15 DIAGNOSIS — Z780 Asymptomatic menopausal state: Secondary | ICD-10-CM

## 2021-08-15 DIAGNOSIS — F419 Anxiety disorder, unspecified: Secondary | ICD-10-CM | POA: Diagnosis not present

## 2021-08-15 DIAGNOSIS — I10 Essential (primary) hypertension: Secondary | ICD-10-CM

## 2021-08-15 DIAGNOSIS — Z1159 Encounter for screening for other viral diseases: Secondary | ICD-10-CM

## 2021-08-15 DIAGNOSIS — Z0000 Encounter for general adult medical examination without abnormal findings: Secondary | ICD-10-CM | POA: Diagnosis not present

## 2021-08-15 LAB — POCT URINALYSIS DIPSTICK
Bilirubin, UA: NEGATIVE
Blood, UA: NEGATIVE
Glucose, UA: NEGATIVE
Ketones, UA: NEGATIVE
Leukocytes, UA: NEGATIVE
Nitrite, UA: NEGATIVE
Protein, UA: NEGATIVE
Spec Grav, UA: 1.01 (ref 1.010–1.025)
Urobilinogen, UA: 0.2 E.U./dL
pH, UA: 6 (ref 5.0–8.0)

## 2021-08-15 LAB — COMPREHENSIVE METABOLIC PANEL
ALT: 13 U/L (ref 0–35)
AST: 19 U/L (ref 0–37)
Albumin: 4.3 g/dL (ref 3.5–5.2)
Alkaline Phosphatase: 78 U/L (ref 39–117)
BUN: 15 mg/dL (ref 6–23)
CO2: 29 mEq/L (ref 19–32)
Calcium: 9.7 mg/dL (ref 8.4–10.5)
Chloride: 101 mEq/L (ref 96–112)
Creatinine, Ser: 0.79 mg/dL (ref 0.40–1.20)
GFR: 86.63 mL/min (ref >60.00)
Glucose, Bld: 82 mg/dL (ref 70–99)
Potassium: 3.8 mEq/L (ref 3.5–5.1)
Sodium: 138 mEq/L (ref 135–145)
Total Bilirubin: 0.7 mg/dL (ref 0.2–1.2)
Total Protein: 7.5 g/dL (ref 6.0–8.3)

## 2021-08-15 LAB — LIPID PANEL
Cholesterol: 189 mg/dL (ref 0–200)
HDL: 79.7 mg/dL (ref >39.00)
LDL Cholesterol: 95 mg/dL (ref 0–99)
NonHDL: 109.35
Total CHOL/HDL Ratio: 2
Triglycerides: 73 mg/dL (ref 0.0–149.0)
VLDL: 14.6 mg/dL (ref 0.0–40.0)

## 2021-08-15 LAB — MAGNESIUM: Magnesium: 2.1 mg/dL (ref 1.5–2.5)

## 2021-08-15 LAB — CBC WITH DIFFERENTIAL/PLATELET
Basophils Absolute: 0 10*3/uL (ref 0.0–0.1)
Basophils Relative: 0.6 % (ref 0.0–3.0)
Eosinophils Absolute: 0.2 10*3/uL (ref 0.0–0.7)
Eosinophils Relative: 3.7 % (ref 0.0–5.0)
HCT: 40 % (ref 36.0–46.0)
Hemoglobin: 13.5 g/dL (ref 12.0–15.0)
Lymphocytes Relative: 42.6 % (ref 12.0–46.0)
Lymphs Abs: 1.7 10*3/uL (ref 0.7–4.0)
MCHC: 33.7 g/dL (ref 30.0–36.0)
MCV: 97.5 fl (ref 78.0–100.0)
Monocytes Absolute: 0.3 10*3/uL (ref 0.1–1.0)
Monocytes Relative: 8.4 % (ref 3.0–12.0)
Neutro Abs: 1.8 10*3/uL (ref 1.4–7.7)
Neutrophils Relative %: 44.7 % (ref 43.0–77.0)
Platelets: 234 10*3/uL (ref 150.0–400.0)
RBC: 4.1 Mil/uL (ref 3.87–5.11)
RDW: 12.9 % (ref 11.5–15.5)
WBC: 4.1 10*3/uL (ref 4.0–10.5)

## 2021-08-15 LAB — T4, FREE: Free T4: 1 ng/dL (ref 0.60–1.60)

## 2021-08-15 LAB — VITAMIN D 25 HYDROXY (VIT D DEFICIENCY, FRACTURES): VITD: 47.4 ng/mL (ref 30.00–100.00)

## 2021-08-15 LAB — HEMOGLOBIN A1C: Hgb A1c MFr Bld: 5.6 % (ref 4.6–6.5)

## 2021-08-15 LAB — TSH: TSH: 2.3 u[IU]/mL (ref 0.35–5.50)

## 2021-08-15 LAB — VITAMIN B12: Vitamin B-12: 852 pg/mL (ref 211–911)

## 2021-08-15 NOTE — Progress Notes (Signed)
Subjective:     Allison Hoffman is a 51 y.o. female and is here for a comprehensive physical exam. Pt started counseling, has second appt this evening.  Starting to see that she has anxiety.  Scored an 18 on the GAD-7 with her therapist.  Pt still having issues with insomnia.  Taking ambien 5-10 mg but states it does not always  work.  Patient also does not like the way Ambien makes her feel.  Patient states her dreams are so vivid that it is hard to tell if she is awake or asleep which is scary.  Has blackout curtains at home but endorses history of light shined through the edges of the curtains making it difficult to sleep.  Patient will often sleep on the couch which is a little cooler.  May get anywhere from a few hours of sleep per night.  Finds her self falling asleep at her desk on breaks at work.  Patient also endorses moments where her vision gets black and she does not realize what  is going on.  Patient concerned potassium is low as she is on HCTZ 12.5 mg and it could be causing issues with her sleep.  Patient stopped taking hydrochlorothiazide for several days but restarted after getting a headache.  Patient started taking OTC magnesium.  Patient request her urine be tested after seeing the video online stating "blood work for magnesium was not accurate".  Patient advised on what a POC UA evaluated.  Social History   Socioeconomic History   Marital status: Married    Spouse name: Not on file   Number of children: Not on file   Years of education: Not on file   Highest education level: Not on file  Occupational History   Not on file  Tobacco Use   Smoking status: Never   Smokeless tobacco: Never  Substance and Sexual Activity   Alcohol use: No   Drug use: No   Sexual activity: Yes  Other Topics Concern   Not on file  Social History Narrative   Not on file   Social Determinants of Health   Financial Resource Strain: Not on file  Food Insecurity: Not on file  Transportation  Needs: Not on file  Physical Activity: Not on file  Stress: Not on file  Social Connections: Not on file  Intimate Partner Violence: Not on file   Health Maintenance  Topic Date Due   HIV Screening  Never done   Hepatitis C Screening  Never done   TETANUS/TDAP  Never done   COLONOSCOPY (Pts 45-80yrs Insurance coverage will need to be confirmed)  Never done   COVID-19 Vaccine (3 - Booster for Pfizer series) 12/25/2019   Zoster Vaccines- Shingrix (1 of 2) Never done   INFLUENZA VACCINE  10/11/2021 (Originally 02/11/2021)   MAMMOGRAM  12/25/2021   PAP SMEAR-Modifier  11/08/2022   HPV VACCINES  Aged Out    The following portions of the patient's history were reviewed and updated as appropriate: allergies, current medications, past family history, past medical history, past social history, past surgical history, and problem list.  Review of Systems Pertinent items noted in HPI and remainder of comprehensive ROS otherwise negative.   Objective:    BP 130/80 (BP Location: Left Arm, Patient Position: Sitting, Cuff Size: Normal)    Pulse 72    Temp 97.6 F (36.4 C) (Oral)    Wt 136 lb 3.2 oz (61.8 kg)    SpO2 99%    BMI  24.13 kg/m  General appearance: alert, cooperative, and no distress Head: Normocephalic, without obvious abnormality, atraumatic Eyes: conjunctivae/corneas clear. PERRL, EOM's intact. Fundi benign. Ears: normal TM's and external ear canals both ears Nose: Nares normal. Septum midline. Mucosa normal. No drainage or sinus tenderness. Throat: lips, mucosa, and tongue normal; teeth and gums normal Neck: no adenopathy, no carotid bruit, no JVD, supple, symmetrical, trachea midline, and thyroid not enlarged, symmetric, no tenderness/mass/nodules Lungs: clear to auscultation bilaterally Heart: regular rate and rhythm, S1, S2 normal, no murmur, click, rub or gallop Abdomen: soft, non-tender; bowel sounds normal; no masses,  no organomegaly Extremities: extremities normal,  atraumatic, no cyanosis or edema Pulses: 2+ and symmetric Skin: Skin color, texture, turgor normal. No rashes or lesions Lymph nodes: Cervical, supraclavicular, and axillary nodes normal. Neurologic: Alert and oriented X 3, normal strength and tone. Normal symmetric reflexes. Normal coordination and gait    Depression screen [facility] 2/9 08/15/2021 07/04/2021  Decreased Interest 1 1  Down, Depressed, Hopeless 1 2  PHQ - 2 Score 2 3  Altered sleeping 3 3  Tired, decreased energy 3 3  Change in appetite 0 0  Feeling bad or failure about yourself  1 -  Trouble concentrating 1 1  Moving slowly or fidgety/restless 2 1  Suicidal thoughts 1 -  PHQ-9 Score 13 11   Assessment:    Healthy female exam with anxiety and insomnia.   Plan:    Anticipatory guidance given including wearing seatbelts, smoke detectors in the home, increasing physical activity, increasing p.o. intake of water and vegetables. -labs -Immunizations reviewed -Mammogram up-to-date -Pap smear up-to-date as done by OB/GYN -Colonoscopy due.  Will place new referral.  Referral placed last year however patient was not ready to schedule at that time. See After Visit Summary for Counseling Recommendations   Essential hypertension -Elevated likely 2/2 recent cessation of medication. -Improving since restarting medication -Continue hydrochlorothiazide 12.5 mg daily -Patient reassured regarding potassium on prior labs - Plan: Lipid panel, CMP, Magnesium  Anxiety  -Per patient GAD-7 score 18 last week -Continue counseling -Consider medication options for continued or worsening symptoms -Discussed the importance of self-care and boundaries - Plan: CBC with Differential/Platelet, TSH, T4, Free  Insomnia, unspecified type -Cause likely multifactorial including menopause, anxiety -Continue sleep hygiene -Discussed Ambien use.  For no improvement in sleep consider starting a different sleep medication or OTC melatonin 5  mg -Continue counseling -TSH and free T4 1 normal on 07/04/2021  - Plan: Magnesium, Vitamin B12, Vitamin D, 25-hydroxy  Menopause -Continue follow-up with OB/GYN  Mixed hyperlipidemia -Lifestyle modifications - Plan: Lipid panel  Encounter for hepatitis C screening test for low risk patient  - Plan: Hep C Antibody  Colon cancer screening  - Plan: Ambulatory referral to Gastroenterology  Follow-up in 1 month, sooner if needed  Abbe Amsterdam, MD

## 2021-08-16 LAB — HEPATITIS C ANTIBODY
Hepatitis C Ab: NONREACTIVE
SIGNAL TO CUT-OFF: 0.02 (ref <1.00)

## 2021-10-28 ENCOUNTER — Ambulatory Visit (INDEPENDENT_AMBULATORY_CARE_PROVIDER_SITE_OTHER): Payer: 59

## 2021-10-28 ENCOUNTER — Ambulatory Visit (INDEPENDENT_AMBULATORY_CARE_PROVIDER_SITE_OTHER): Payer: 59 | Admitting: Family Medicine

## 2021-10-28 ENCOUNTER — Encounter: Payer: Self-pay | Admitting: Family Medicine

## 2021-10-28 VITALS — BP 151/93 | HR 69 | Temp 98.5°F | Wt 133.8 lb

## 2021-10-28 DIAGNOSIS — R630 Anorexia: Secondary | ICD-10-CM | POA: Diagnosis not present

## 2021-10-28 DIAGNOSIS — R052 Subacute cough: Secondary | ICD-10-CM | POA: Diagnosis not present

## 2021-10-28 DIAGNOSIS — R5383 Other fatigue: Secondary | ICD-10-CM

## 2021-10-28 MED ORDER — BENZONATATE 100 MG PO CAPS: 100.0000 mg | ORAL_CAPSULE | Freq: Two times a day (BID) | ORAL | 0 refills | Status: DC | PRN

## 2021-10-28 NOTE — Progress Notes (Signed)
Subjective:  ? ? Patient ID: Allison Hoffman, female    DOB: [birth year], 51 y.o.   MRN: [id number] ? ?Chief Complaint  ?Patient presents with  ? Cough  ?  With fatigue and loss of appetite, comes ans goes. Dry cough, starting to have a little phlegm, has not tried anything. Home Covid test was neg.   ? ? ?HPI ?Patient was seen today for ongoing concern.  Pt with a dry cough x1.5 months.  Patient states when symptoms initially started she may have caught a cold from her husband.  Symptoms improved then cough returned.  Patient also has intermittent fatigue and decreased appetite.  Denies fever, sore throat, rhinorrhea, postnasal drainage, reflux.  Has not tried anything for symptoms. ? ?Past Medical History:  ?Diagnosis Date  ? Chicken pox   ? Depression   ? Hypertension   ? Positive TB test   ? Screen for STD (sexually transmitted disease)   ? ? ?Allergies  ?Allergen Reactions  ? Amlodipine   ?  Swelling unable to breathe   ? Codeine   ? Tylenol With Codeine #3 [Acetaminophen-Codeine] Hives  ? ? ?ROS ?General: Denies fever, chills, night sweats, changes in weight +fatigue, decreased appetite ?HEENT: Denies headaches, ear pain, changes in vision, rhinorrhea, sore throat ?CV: Denies CP, palpitations, SOB, orthopnea ?Pulm: Denies SOB, wheezing  +dry cough ?GI: Denies abdominal pain, nausea, vomiting, diarrhea, constipation   ?GU: Denies dysuria, hematuria, frequency, vaginal discharge ?Msk: Denies muscle cramps, joint pains ?Neuro: Denies weakness, numbness, tingling ?Skin: Denies rashes, bruising ?Psych: Denies depression, anxiety, hallucinations ? ?   ?Objective:  ?  ?Blood pressure (!) 151/93, pulse 69, temperature 98.5 ?F (36.9 ?C), temperature source Oral, weight 133 lb 12.8 oz (60.7 kg), SpO2 100 %. ? ?Gen. Pleasant, well-nourished, in no distress, normal affect   ?HEENT: [place]/AT, face symmetric, conjunctiva clear, no scleral icterus, PERRLA, EOMI, nares patent without drainage, pharynx without erythema or exudate.   TMs normal bilaterally. ?Lungs: no accessory muscle use, CTAB, no wheezes or rales ?Cardiovascular: RRR, no m/r/g, no peripheral edema ?Musculoskeletal: No deformities, no cyanosis or clubbing, normal tone ?Neuro:  A&Ox3, CN II-XII intact, normal gait ?Skin:  Warm, no lesions/ rash ? ? ?Wt Readings from Last 3 Encounters:  ?08/15/21 136 lb 3.2 oz (61.8 kg)  ?07/04/21 134 lb 9.6 oz (61.1 kg)  ?01/02/21 128 lb 9.6 oz (58.3 kg)  ? ? ?Lab Results  ?Component Value Date  ? WBC 4.1 08/15/2021  ? HGB 13.5 08/15/2021  ? HCT 40.0 08/15/2021  ? PLT 234.0 08/15/2021  ? GLUCOSE 82 08/15/2021  ? CHOL 189 08/15/2021  ? TRIG 73.0 08/15/2021  ? HDL 79.70 08/15/2021  ? LDLCALC 95 08/15/2021  ? ALT 13 08/15/2021  ? AST 19 08/15/2021  ? NA 138 08/15/2021  ? K 3.8 08/15/2021  ? CL 101 08/15/2021  ? CREATININE 0.79 08/15/2021  ? BUN 15 08/15/2021  ? CO2 29 08/15/2021  ? TSH 2.30 08/15/2021  ? HGBA1C 5.6 08/15/2021  ? ? ?Assessment/Plan: ? ?Subacute cough  ?-Dry cough x1.5 months ?-Discussed possible causes including postviral cough, bronchitis, pneumonia.  Also consider GERD however patient denies heartburn symptoms. ?-Expectant management encouraged ?-Will obtain CXR given duration of symptoms ?-Rx for Tessalon sent to pharmacy, but patient does not like taking medications. ?-Discussed prednisone however patient declines at this time ?-Given precautions ?- Plan: DG Chest 2 View, benzonatate (TESSALON) 100 MG capsule ? ?Other fatigue ?-Likely 2/2 continued coughing ?-Supportive care ? ?Decreased appetite ?-Likely  2/2 cough and recent illness ?-Patient encouraged to eat small meals and snacks throughout the day does not feel like eating much ?-Encouraged to stay hydrated ? ?F/u prn ? ?Abbe Amsterdam, MD ?

## 2021-10-28 NOTE — Patient Instructions (Signed)
We will obtain a chest x-ray while you are here in clinic. ? ?A prescription for Tessalon capsule you can take for cough was sent to your pharmacy.  You can continue taking over-the-counter medicine if needed ?

## 2021-10-30 ENCOUNTER — Telehealth: Payer: Self-pay | Admitting: Family Medicine

## 2021-10-31 ENCOUNTER — Ambulatory Visit (HOSPITAL_COMMUNITY)
Admission: EM | Admit: 2021-10-31 | Discharge: 2021-10-31 | Disposition: A | Discharge status: Home / Self Care | Payer: 59 | Attending: Family Medicine | Admitting: Family Medicine

## 2021-10-31 ENCOUNTER — Other Ambulatory Visit: Payer: Self-pay

## 2021-10-31 ENCOUNTER — Encounter (HOSPITAL_COMMUNITY): Payer: Self-pay | Admitting: Emergency Medicine

## 2021-10-31 ENCOUNTER — Telehealth (INDEPENDENT_AMBULATORY_CARE_PROVIDER_SITE_OTHER): Payer: 59 | Admitting: Family Medicine

## 2021-10-31 DIAGNOSIS — T7840XA Allergy, unspecified, initial encounter: Secondary | ICD-10-CM | POA: Diagnosis not present

## 2021-10-31 DIAGNOSIS — R052 Subacute cough: Secondary | ICD-10-CM

## 2021-10-31 DIAGNOSIS — R630 Anorexia: Secondary | ICD-10-CM

## 2021-10-31 DIAGNOSIS — R5383 Other fatigue: Secondary | ICD-10-CM | POA: Diagnosis not present

## 2021-10-31 MED ORDER — PREDNISONE 10 MG PO TABS: ORAL_TABLET | ORAL | 0 refills | Status: DC

## 2021-10-31 MED ORDER — TRIAMCINOLONE ACETONIDE 40 MG/ML IJ SUSP
INTRAMUSCULAR | Status: AC
  Filled 2021-10-31: qty 1

## 2021-10-31 MED ORDER — TRIAMCINOLONE ACETONIDE 40 MG/ML IJ SUSP
40.0000 mg | Freq: Once | INTRAMUSCULAR | Status: AC
  Administered 2021-10-31: 40 mg via INTRAMUSCULAR

## 2021-10-31 NOTE — Progress Notes (Signed)
Virtual Visit via Video Note ? ?I connected with Allison Hoffman on 10/31/21 at  4:30 PM EDT by a video enabled telemedicine application 2/2 COVID-19 pandemic and verified that I am speaking with the correct person using two identifiers. ? Location patient: home ?Location provider:work or home office ?Persons participating in the virtual visit: patient, provider ? ?I discussed the limitations of evaluation and management by telemedicine and the availability of in person appointments. The patient expressed understanding and agreed to proceed. ? ? ?HPI: ? ?Pt with increased cough since visit on Monday 10/28/21 and fatigue. Pt expresses frustration over continued symptoms.  Pt also upset no one called her immediately with her normal CXR results.  Pt did not try the tessalon given at appt 4/17.  States fatigue and coughing spells come in waves.  Starting to feel thick phlegm in the back of her throat.  ? ?ROS: See pertinent positives and negatives per HPI. ? ?Past Medical History:  ?Diagnosis Date  ? Chicken pox   ? Depression   ? Hypertension   ? Positive TB test   ? Screen for STD (sexually transmitted disease)   ? ? ?Past Surgical History:  ?Procedure Laterality Date  ? BREAST BIOPSY  1997  ? OOPHORECTOMY    ? ? ?Family History  ?Problem Relation Age of Onset  ? Cancer Mother   ? Hypertension Mother   ? Mental illness Mother   ? Kidney disease Father   ? ?Current Outpatient Medications:  ?  benzonatate (TESSALON) 100 MG capsule, Take 1 capsule (100 mg total) by mouth 2 (two) times daily as needed for cough., Disp: 20 capsule, Rfl: 0 ?  EPINEPHrine 0.3 mg/0.3 mL IJ SOAJ injection, Inject 0.3 mg into the muscle as needed for anaphylaxis., Disp: 2 each, Rfl: 2 ?  estradiol (ESTRACE) 0.5 MG tablet, estradiol 0.5 mg tablet  TAKE 1 TABLET BY MOUTH IN THE MORNING AND 1 TABLET IN THE EVENING, Disp: , Rfl:  ?  hydrochlorothiazide (HYDRODIURIL) 12.5 MG tablet, TK 1 T PO QAM, Disp: 30 tablet, Rfl: 1 ?  MAGNESIUM PO, Take by  mouth., Disp: , Rfl:  ?  medroxyPROGESTERone (PROVERA) 2.5 MG tablet, medroxyprogesterone 2.5 mg tablet  TAKE 1 TABLET BY MOUTH EVERY DAY, Disp: , Rfl:  ?  predniSONE (DELTASONE) 10 MG tablet, Take 4 tabs every morning for 3 days, 3 tabs for 2 days, 2 tabs for 2 days, 1 tab for 1 day., Disp: 23 tablet, Rfl: 0 ?  zolpidem (AMBIEN) 10 MG tablet, zolpidem 10 mg tablet  TAKE 1/2 TO 1 TABLET BY MOUTH EVERY NIGHT 20 MINUTES BEFORE BEDTIME FOR SLEEP, Disp: , Rfl:  ? ?EXAM: ? ?VITALS per patient if applicable: RR between 12-20 bpm ? ?GENERAL: alert, oriented, appears well and in no acute distress ? ?HEENT: atraumatic, conjunctiva clear, no obvious abnormalities on inspection of external nose and ears ? ?NECK: normal movements of the head and neck ? ?LUNGS: intermittent cough, on inspection no signs of respiratory distress, breathing rate appears normal, no obvious gross SOB, gasping or wheezing ? ?CV: no obvious cyanosis ? ?MS: moves all visible extremities without noticeable abnormality ? ?PSYCH/NEURO: pleasant and cooperative, no obvious depression or anxiety, speech and thought processing grossly intact ? ?ASSESSMENT AND PLAN: ? ?Discussed the following assessment and plan: ? ?Subacute cough ?-CXR 10/28/21 normal.  Pt advised imaging was not read until the next day when this provider was out of the office. ?-pt to try tessalon  ?-continue expectant management warm fluids,  honey, etc ?-Declined prednisone on 10/28/21, but open to trying.  Rx sent to pharmacy for prednisone taper ?-given precautions ?-Plan: Prednisone 10 mg ? ?F/u prn ?  ?I discussed the assessment and treatment plan with the patient. The patient was provided an opportunity to ask questions and all were answered. The patient agreed with the plan and demonstrated an understanding of the instructions. ?  ?The patient was advised to call back or seek an in-person evaluation if the symptoms worsen or if the condition fails to improve as anticipated. ? ?Deeann Saint, MD  ? ?

## 2021-10-31 NOTE — ED Provider Notes (Signed)
?[facility] ? ? ? ?CSN: [id number] ?Arrival date & time: 10/31/21  1915 ? ? ?  ? ?History   ?Chief Complaint ?Chief Complaint  ?Patient presents with  ? Allergic Reaction  ? ? ?HPI ?Allison Hoffman is a 51 y.o. female.  ? ? ?Allergic Reaction ?Here for swelling in her lips and around her eyes that happened this afternoon about 30 minutes to 45 minutes after she took benzonatate for cough.  Her husband states that now it is subsiding some.  She felt itchy kind of all over for a little bit but that has gone away also.  No dyspnea or wheezing ? ?Past Medical History:  ?Diagnosis Date  ? Chicken pox   ? Depression   ? Hypertension   ? Positive TB test   ? Screen for STD (sexually transmitted disease)   ? ? ?Patient Active Problem List  ? Diagnosis Date Noted  ? Right wrist pain 01/02/2021  ? Essential hypertension 08/08/2019  ? ? ?Past Surgical History:  ?Procedure Laterality Date  ? BREAST BIOPSY  1997  ? OOPHORECTOMY    ? ? ?OB History   ?No obstetric history on file. ?  ? ? ? ?Home Medications   ? ?Prior to Admission medications   ?Medication Sig Start Date End Date Taking? Authorizing Provider  ?EPINEPHrine 0.3 mg/0.3 mL IJ SOAJ injection Inject 0.3 mg into the muscle as needed for anaphylaxis. 08/03/20   Deeann Saint, MD  ?estradiol (ESTRACE) 0.5 MG tablet estradiol 0.5 mg tablet ? TAKE 1 TABLET BY MOUTH IN THE MORNING AND 1 TABLET IN THE EVENING    [provider]  ?hydrochlorothiazide (HYDRODIURIL) 12.5 MG tablet TK 1 T PO QAM 09/15/18   Deeann Saint, MD  ?MAGNESIUM PO Take by mouth.    [provider]  ?medroxyPROGESTERone (PROVERA) 2.5 MG tablet medroxyprogesterone 2.5 mg tablet ? TAKE 1 TABLET BY MOUTH EVERY DAY    [provider]  ?predniSONE (DELTASONE) 10 MG tablet Take 5 tabs on day 1, 4 tabs on day 2, 3 tabs on day 3, 2 tabs on day 4, 1 tab on day 5. 10/31/21   Deeann Saint, MD  ?zolpidem (AMBIEN) 10 MG tablet zolpidem 10 mg tablet ? TAKE 1/2 TO 1 TABLET BY  MOUTH EVERY NIGHT 20 MINUTES BEFORE BEDTIME FOR SLEEP    [provider]  ? ? ?Family History ?Family History  ?Problem Relation Age of Onset  ? Cancer Mother   ? Hypertension Mother   ? Mental illness Mother   ? Kidney disease Father   ? ? ?Social History ?Social History  ? ?Tobacco Use  ? Smoking status: Never  ? Smokeless tobacco: Never  ?Substance Use Topics  ? Alcohol use: No  ? Drug use: No  ? ? ? ?Allergies   ?Amlodipine, Codeine, and Tylenol with codeine #3 [acetaminophen-codeine] ? ? ?Review of Systems ?Review of Systems ? ? ?Physical Exam ?Triage Vital Signs ?ED Triage Vitals  ?Enc Vitals Group  ?   BP 10/31/21 1932 (!) 186/102  ?   Pulse Rate 10/31/21 1932 75  ?   Resp 10/31/21 1932 18  ?   Temp 10/31/21 1932 98.4 ?F (36.9 ?C)  ?   Temp Source 10/31/21 1932 Oral  ?   SpO2 10/31/21 1932 100 %  ?   Weight --   ?   Height --   ?   Head Circumference --   ?   Peak Flow --   ?  Pain Score 10/31/21 1931 0  ?   Pain Loc --   ?   Pain Edu? --   ?   Excl. in GC? --   ? ?No data found. ? ?Updated Vital Signs ?BP (!) 200/92 (BP Location: Right Arm)   Pulse 75   Temp 98.4 ?F (36.9 ?C) (Oral)   Resp 18   SpO2 100%  ? ?Visual Acuity ?Right Eye Distance:   ?Left Eye Distance:   ?Bilateral Distance:   ? ?Right Eye Near:   ?Left Eye Near:    ?Bilateral Near:    ? ?Physical Exam ?Vitals reviewed.  ?Constitutional:   ?   General: She is not in acute distress. ?   Appearance: She is not toxic-appearing.  ?HENT:  ?   Nose: Nose normal.  ?   Mouth/Throat:  ?   Mouth: Mucous membranes are moist.  ?   Pharynx: No oropharyngeal exudate or posterior oropharyngeal erythema.  ?Eyes:  ?   Extraocular Movements: Extraocular movements intact.  ?   Conjunctiva/sclera: Conjunctivae normal.  ?   Pupils: Pupils are equal, round, and reactive to light.  ?Cardiovascular:  ?   Rate and Rhythm: Normal rate and regular rhythm.  ?   Heart sounds: No murmur heard. ?Pulmonary:  ?   Effort: Pulmonary effort is normal. No respiratory  distress.  ?   Breath sounds: No stridor. No wheezing, rhonchi or rales.  ?Musculoskeletal:  ?   Cervical back: Neck supple.  ?Lymphadenopathy:  ?   Cervical: No cervical adenopathy.  ?Skin: ?   Coloration: Skin is not jaundiced or pale.  ?Neurological:  ?   Mental Status: She is oriented to person, place, and time.  ?Psychiatric:     ?   Behavior: Behavior normal.  ? ? ? ?UC Treatments / Results  ?Labs ?(all labs ordered are listed, but only abnormal results are displayed) ?Labs Reviewed - No data to display ? ?EKG ? ? ?Radiology ?No results found. ? ?Procedures ?Procedures (including critical care time) ? ?Medications Ordered in UC ?Medications  ?triamcinolone acetonide (KENALOG-40) injection 40 mg (has no administration in time range)  ? ? ?Initial Impression / Assessment and Plan / UC Course  ?I have reviewed the triage vital signs and the nursing notes. ? ?Pertinent labs & imaging results that were available during my care of the patient were reviewed by me and considered in my medical decision making (see chart for details). ? ?  ? ?We will treat with a steroid injection.  Here PCP has already sent in a prednisone rx this afternoon. ?Final Clinical Impressions(s) / UC Diagnoses  ? ?Final diagnoses:  ?Allergic reaction, initial encounter  ? ? ? ?Discharge Instructions   ? ?  ?You have been given a steroid shot today for allergic reaction, triamcinolone 40 mg  ? ?Take the prednisone already sent in by your provider. ? ? ? ? ? ? ?ED Prescriptions   ?None ?  ? ?PDMP not reviewed this encounter. ?  ?Zenia Resides, MD ?10/31/21 1952 ? ?

## 2021-10-31 NOTE — Discharge Instructions (Addendum)
You have been given a steroid shot today for allergic reaction, triamcinolone 40 mg  ? ?Take the prednisone already sent in by your provider. ? ? ?

## 2021-10-31 NOTE — ED Triage Notes (Signed)
Pt reports that she took Tessalon tablet and about 45 minutes later felt like face started swelling and itching. Reports eye lids started swelling felt needed to be seen. Denies swelling to tongue. Reports back of throat feels dry.  ?

## 2021-10-31 NOTE — Telephone Encounter (Signed)
Pt calling regarding the results of the xray she had on 10/28/21. States she is awaiting treatment for the symptoms (coughing is worse productive) she is experiencing. feels as if she should have gotten a follow up call from her provider by now. Requesting a call from nurse.  ?

## 2021-10-31 NOTE — Telephone Encounter (Signed)
Spoke with pt, is aware. Virtual visit scheduled for today at 4:30 pm. ?

## 2021-11-01 ENCOUNTER — Telehealth: Payer: Self-pay | Admitting: Family Medicine

## 2021-11-01 NOTE — Telephone Encounter (Signed)
Pt went to urgent care yesterday with face swelling and per pt was told to let md know she is allergic to benzonate and has started prednisone. Please advise ?

## 2021-11-01 NOTE — Telephone Encounter (Signed)
Noted.  This is definitely unfortunate to hear about.  Will see if pt can f/u next wk.  For continued or worsened symptoms over the weekend proceed to nearest ED. ?

## 2021-11-01 NOTE — Telephone Encounter (Signed)
Spoke to pt and she declined appt. F/u. Pt stated she is okay and she will call back if she needs anything.  ?

## 2021-11-10 ENCOUNTER — Encounter: Payer: Self-pay | Admitting: Family Medicine

## 2021-12-27 NOTE — Telephone Encounter (Signed)
error 

## 2022-01-06 LAB — HM MAMMOGRAPHY

## 2022-01-06 LAB — HM PAP SMEAR

## 2022-01-21 ENCOUNTER — Encounter: Payer: Self-pay | Admitting: Family Medicine

## 2022-01-25 IMAGING — DX DG CHEST 2V
2 series · 2 of 2 positions shown · non-contrast
Comparison: 07/05/2018

CLINICAL DATA: Cough 1 month

EXAM:
CHEST - 2 VIEW

[chest pa]
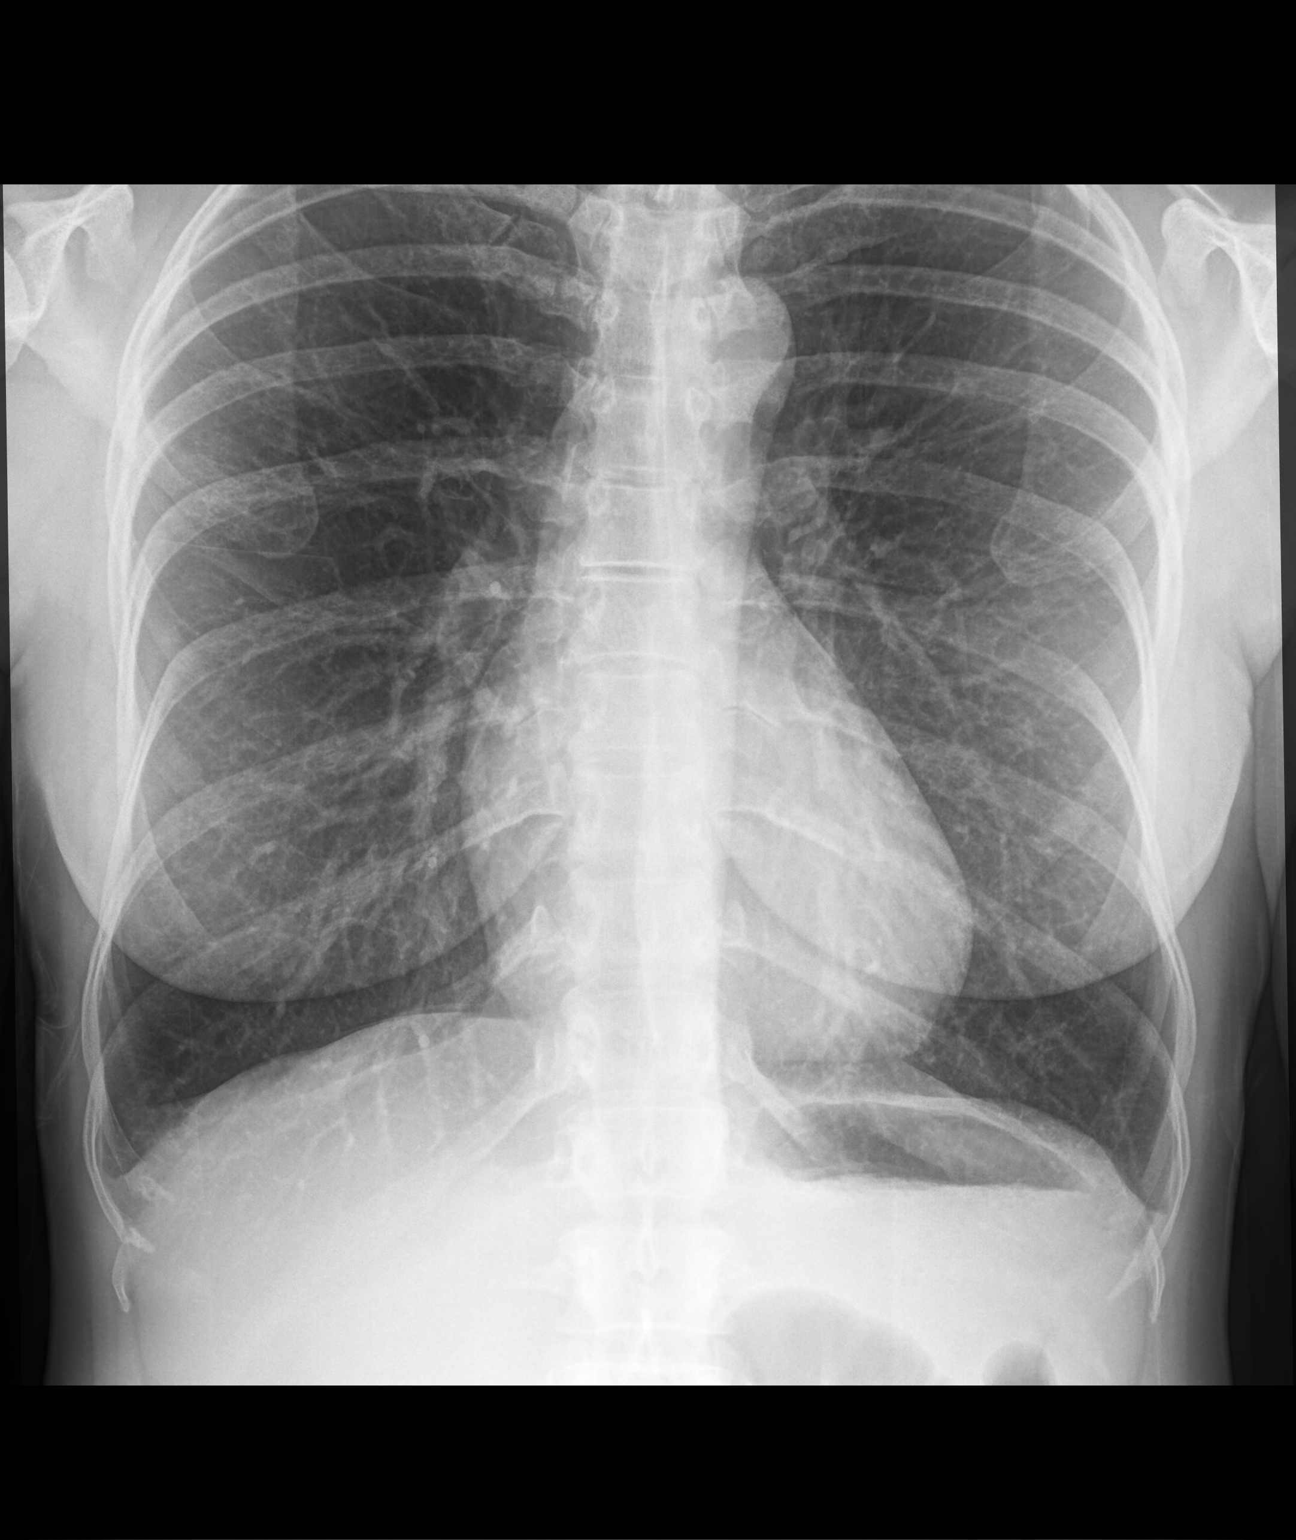

[chest lat]
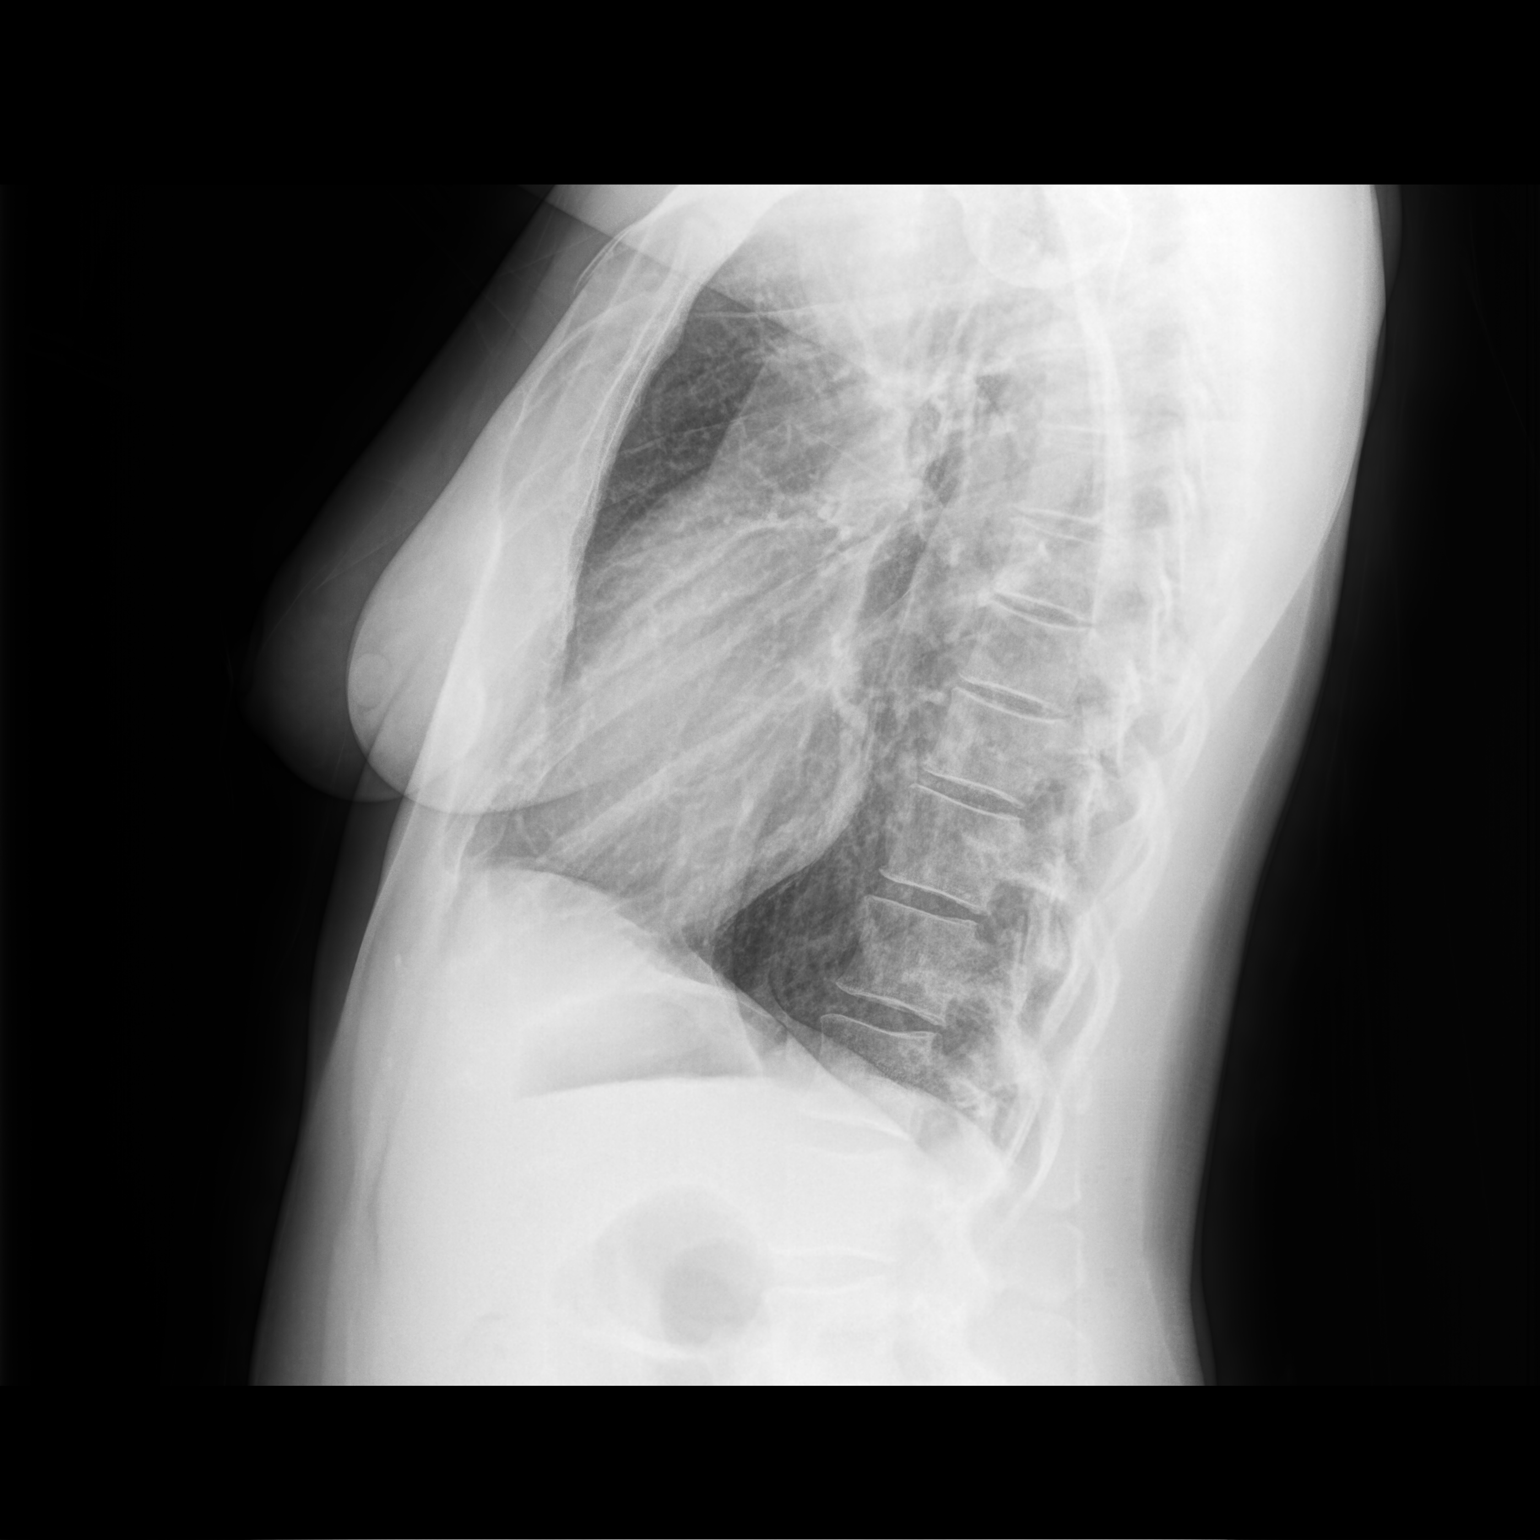

[2 of 2 positions shown; findings below may reference images not displayed]

FINDINGS: Pulmonary hyperinflation. Lungs are well aerated and clear. No
infiltrate effusion or mass. Heart size and vascularity normal.
IMPRESSION: No acute abnormality.  Pulmonary hyperinflation.

## 2022-05-22 ENCOUNTER — Encounter: Payer: Self-pay | Admitting: Internal Medicine

## 2022-05-22 ENCOUNTER — Ambulatory Visit (INDEPENDENT_AMBULATORY_CARE_PROVIDER_SITE_OTHER): Payer: 59 | Admitting: Internal Medicine

## 2022-05-22 ENCOUNTER — Telehealth: Payer: Self-pay | Admitting: Family Medicine

## 2022-05-22 VITALS — BP 120/78 | HR 91 | Temp 99.5°F | Wt 136.5 lb

## 2022-05-22 DIAGNOSIS — R0981 Nasal congestion: Secondary | ICD-10-CM

## 2022-05-22 DIAGNOSIS — J029 Acute pharyngitis, unspecified: Secondary | ICD-10-CM | POA: Diagnosis not present

## 2022-05-22 DIAGNOSIS — R531 Weakness: Secondary | ICD-10-CM

## 2022-05-22 DIAGNOSIS — J020 Streptococcal pharyngitis: Secondary | ICD-10-CM

## 2022-05-22 LAB — POCT INFLUENZA A/B
Influenza A, POC: NEGATIVE
Influenza B, POC: NEGATIVE

## 2022-05-22 LAB — POC COVID19 BINAXNOW: SARS Coronavirus 2 Ag: NEGATIVE

## 2022-05-22 LAB — POCT RAPID STREP A (OFFICE): Rapid Strep A Screen: POSITIVE — AB

## 2022-05-22 MED ORDER — AMOXICILLIN 500 MG PO TABS: 500.0000 mg | ORAL_TABLET | Freq: Two times a day (BID) | ORAL | 0 refills | Status: DC

## 2022-05-22 NOTE — Progress Notes (Signed)
Established Patient Office Visit     CC/Reason for Visit: Sore throat, weakness, cough  HPI: Allison Hoffman is a 51 y.o. female who is coming in today for the above mentioned reasons.  4 days ago she started experiencing a sore throat.  It has progressed to having significant rhinorrhea, postnasal drainage, cough and weakness.  She has been having low-grade temps of around 99-99.8, it is 99.5 in office today.   Past Medical/Surgical History: Past Medical History:  Diagnosis Date   Chicken pox    Depression    Hypertension    Positive TB test    Screen for STD (sexually transmitted disease)     Past Surgical History:  Procedure Laterality Date   BREAST BIOPSY  1997   OOPHORECTOMY      Social History:  reports that she has never smoked. She has never used smokeless tobacco. She reports that she does not drink alcohol and does not use drugs.  Allergies: Allergies  Allergen Reactions   Amlodipine     Swelling unable to breathe    Codeine    Latex Swelling   Peanut-Containing Drug Products    Pistachio Nut Extract Skin Test    Tessalon [Benzonatate] Swelling    Facial swelling   Tylenol With Codeine #3 [Acetaminophen-Codeine] Hives    Family History:  Family History  Problem Relation Age of Onset   Cancer Mother    Hypertension Mother    Mental illness Mother    Kidney disease Father      Current Outpatient Medications:    amoxicillin (AMOXIL) 500 MG tablet, Take 1 tablet (500 mg total) by mouth 2 (two) times daily for 10 days., Disp: 20 tablet, Rfl: 0   EPINEPHrine 0.3 mg/0.3 mL IJ SOAJ injection, Inject 0.3 mg into the muscle as needed for anaphylaxis., Disp: 2 each, Rfl: 2   estradiol (ESTRACE) 0.5 MG tablet, estradiol 0.5 mg tablet  TAKE 1 TABLET BY MOUTH IN THE MORNING AND 1 TABLET IN THE EVENING, Disp: , Rfl:    hydrochlorothiazide (HYDRODIURIL) 12.5 MG tablet, TK 1 T PO QAM, Disp: 30 tablet, Rfl: 1   MAGNESIUM PO, Take by mouth., Disp: , Rfl:     medroxyPROGESTERone (PROVERA) 2.5 MG tablet, medroxyprogesterone 2.5 mg tablet  TAKE 1 TABLET BY MOUTH EVERY DAY, Disp: , Rfl:    zolpidem (AMBIEN) 10 MG tablet, zolpidem 10 mg tablet  TAKE 1/2 TO 1 TABLET BY MOUTH EVERY NIGHT 20 MINUTES BEFORE BEDTIME FOR SLEEP, Disp: , Rfl:   Review of Systems:  Constitutional: Positive for fever, chills, diaphoresis, appetite change and fatigue.  HEENT: Denies photophobia, eye pain, redness, neck pain, neck stiffness and tinnitus.   Respiratory: Denies SOB, DOE, cough, chest tightness,  and wheezing.   Cardiovascular: Denies chest pain, palpitations and leg swelling.  Gastrointestinal: Denies nausea, vomiting, abdominal pain, diarrhea, constipation, blood in stool and abdominal distention.  Genitourinary: Denies dysuria, urgency, frequency, hematuria, flank pain and difficulty urinating.  Endocrine: Denies: hot or cold intolerance, sweats, changes in hair or nails, polyuria, polydipsia. Musculoskeletal: Denies myalgias, back pain, joint swelling, arthralgias and gait problem.  Skin: Denies pallor, rash and wound.  Neurological: Denies dizziness, seizures, syncope, light-headedness, numbness and headaches.  Hematological: Denies adenopathy. Easy bruising, personal or family bleeding history  Psychiatric/Behavioral: Denies suicidal ideation, mood changes, confusion, nervousness, sleep disturbance and agitation    Physical Exam: Vitals:   05/22/22 1602  BP: 120/78  Pulse: 91  Temp: 99.5 F (37.5 C)  TempSrc: Oral  SpO2: 99%  Weight: 136 lb 8 oz (61.9 kg)    Body mass index is 24.18 kg/m.   Constitutional: NAD, calm, comfortable Eyes: PERRL, lids and conjunctivae normal ENMT: Mucous membranes are moist. Posterior pharynx is erythematous but clear of any exudate or lesions. Normal dentition. Tympanic membrane is pearly white, no erythema or bulging. Neck: normal, supple, no masses, no thyromegaly Respiratory: clear to auscultation bilaterally, no  wheezing, no crackles. Normal respiratory effort. No accessory muscle use.  Cardiovascular: Regular rate and rhythm, no murmurs / rubs / gallops. No extremity edema.   Psychiatric: Normal judgment and insight. Alert and oriented x 3. Normal mood.    Impression and Plan:  Strep pharyngitis - Plan: amoxicillin (AMOXIL) 500 MG tablet  Sore throat - Plan: POC Influenza A/B, POC Rapid Strep A  Nasal congestion - Plan: POC COVID-19, POC Influenza A/B  Weakness  -In office flu and COVID tests have resulted negative. -Rapid strep test is positive.  Treat with amoxicillin 500 mg twice daily for 10 days.  Time spent:30 minutes reviewing chart, interviewing and examining patient and formulating plan of care.     Chaya Jan, MD [facility]

## 2022-05-22 NOTE — Telephone Encounter (Signed)
Patient wants to transfer to Dr.Hernandez from Dr.Banks. Patient states that she has already received the okay verbally from Dr.Hernandez, during her acute visit on 11/09. Okay to schedule?         Please advise?

## 2022-05-23 NOTE — Telephone Encounter (Signed)
Ok

## 2022-05-26 ENCOUNTER — Telehealth: Payer: Self-pay | Admitting: Family Medicine

## 2022-05-26 NOTE — Telephone Encounter (Signed)
Pt called to say she was having a bad reaction to the antibiotics. Pt was transferred to Triage Nurse.  Triage Nurse called back to ask that Pt be seen within 3-4 hrs.    Entire schedule was checked for availability here and at [facility], and there was nothing available.  TN said she would advise Pt to go to Urgent Care.

## 2022-05-27 ENCOUNTER — Encounter: Payer: Self-pay | Admitting: Family Medicine

## 2022-05-27 ENCOUNTER — Ambulatory Visit (INDEPENDENT_AMBULATORY_CARE_PROVIDER_SITE_OTHER): Payer: 59 | Admitting: Family Medicine

## 2022-05-27 VITALS — BP 120/80 | HR 66 | Temp 98.3°F | Wt 136.0 lb

## 2022-05-27 DIAGNOSIS — J020 Streptococcal pharyngitis: Secondary | ICD-10-CM | POA: Diagnosis not present

## 2022-05-27 DIAGNOSIS — T7840XA Allergy, unspecified, initial encounter: Secondary | ICD-10-CM | POA: Diagnosis not present

## 2022-05-27 MED ORDER — AZITHROMYCIN 250 MG PO TABS: ORAL_TABLET | ORAL | 0 refills | Status: DC

## 2022-05-27 NOTE — Progress Notes (Signed)
   Subjective:    Patient ID: Allison Hoffman, female    DOB: 1970/09/16, 51 y.o.   MRN: [id number]  HPI Here for an allergic reaction to Amoxicillin. About 10 days ago she developed low grade fevers, headache, a dry cough, and a ST. She saw Dr. Ardyth Harps on 05-22-22 and she tested positive for strep. She was negative for Covid and flu. She was started on Amoxicillin (which she has tolerated well in the past) and her strep symptoms began to improve. However after 4 and 1/2 days of taking this she began to have swelling in the face and generalized itching. No throat swelling or SOB or rashes. She stopped taking this 2 days ago, and now the itching and swelling are getting better. She notes that she had the same reactions to the second Covid vaccine she took on 03-12-20, and now she has reacted to sevral medications since then. She says she will never get another Covid vaccine.    Review of Systems  Constitutional: Negative.   HENT:  Positive for congestion, facial swelling, postnasal drip and sore throat. Negative for ear pain and sinus pressure.   Eyes: Negative.   Respiratory:  Positive for cough. Negative for shortness of breath and wheezing.   Gastrointestinal: Negative.        Objective:   Physical Exam Constitutional:      Appearance: Normal appearance. She is not ill-appearing.     Comments: Her face has no visible swelling today  HENT:     Right Ear: Tympanic membrane, ear canal and external ear normal.     Left Ear: Tympanic membrane, ear canal and external ear normal.     Nose: Nose normal.     Mouth/Throat:     Pharynx: Oropharynx is clear.  Eyes:     Conjunctiva/sclera: Conjunctivae normal.  Pulmonary:     Effort: Pulmonary effort is normal.     Breath sounds: Normal breath sounds.  Lymphadenopathy:     Cervical: No cervical adenopathy.  Neurological:     Mental Status: She is alert.           Assessment & Plan:  She has had an allergic reaction to Amoxicillin, so  we noted this in her chart. Whether this is related to the Covid vaccine is unknown. She can take Benadryl 50 mg every 4-6 hours as needed until this resolves. Her strep infection is only partially treated, so we will give her a Zpack. Recheck as needed. Gershon Crane, MD

## 2022-05-27 NOTE — Telephone Encounter (Signed)
-  Caller states she was seen on Thursday and prescribed medication but when woke up this morning her face was puffy and she is to take another dose this afternoon and want to make sure she does. Has a lot of allergies since second Covid injection. States she normally gets her first signs of interaction with facial symptoms. Taking abx since Thursday, took dose this morning and then took a nap. When woke up she has facial swelling and some itching. Amoxicillin 500 mg BID for Strep  05/26/2022 3:44:11 PM See HCP within 4 Hours (or PCP triage) Lesly Rubenstein, RN, [place]  Comments User: Floyce Stakes, RN Date/Time (Eastern Time): 05/26/2022 3:40:06 PM Caller states the facial swelling is pretty much gone at this time, may have just a little swelling in cheeks. States it is noticeable right now but not drastic.  User: Floyce Stakes, RN Date/Time Lamount Cohen Time): 05/26/2022 3:43:55 PM Attempted to call back line, rings multiple times but no answer, went to busy signal. Attempting main number to the facility at this time.  User: Floyce Stakes, RN Date/Time Lamount Cohen Time): 05/26/2022 3:55:14 PM Caller states she does not want to go to UC, prefers to not take tonights dose and see someone at the office tomorrow.  User: Floyce Stakes, RN Date/Time Lamount Cohen Time): 05/26/2022 3:57:56 PM Caller preferred to get appt tomorrow so I connected her to the front desk for the appt  Referrals Warm transfer to backline  Pt has appt on 05/27/22 with Dr Clent Ridges

## 2022-06-18 ENCOUNTER — Telehealth: Payer: Self-pay | Admitting: Internal Medicine

## 2022-06-18 NOTE — Telephone Encounter (Signed)
Pt called stating she saw Dr. On 11/9 and is still having a lingering cough. Wanted to see if nurse can give her a call so she can see if she needs another round of antibiotics or if she needs to get a chest xray.   Please call pt at #[phone]  Please advise

## 2022-06-18 NOTE — Telephone Encounter (Signed)
Patient is aware.  Appointment scheduled. °

## 2022-06-19 ENCOUNTER — Encounter: Payer: Self-pay | Admitting: Family Medicine

## 2022-06-19 ENCOUNTER — Ambulatory Visit (INDEPENDENT_AMBULATORY_CARE_PROVIDER_SITE_OTHER): Payer: 59 | Admitting: Family Medicine

## 2022-06-19 ENCOUNTER — Ambulatory Visit (INDEPENDENT_AMBULATORY_CARE_PROVIDER_SITE_OTHER): Payer: 59

## 2022-06-19 VITALS — BP 130/80 | HR 72 | Temp 98.6°F | Wt 137.2 lb

## 2022-06-19 DIAGNOSIS — R052 Subacute cough: Secondary | ICD-10-CM | POA: Diagnosis not present

## 2022-06-19 DIAGNOSIS — R0982 Postnasal drip: Secondary | ICD-10-CM

## 2022-06-19 NOTE — Progress Notes (Signed)
Subjective:    Patient ID: Allison Hoffman, female    DOB: 1971-01-06, 51 y.o.   MRN: [id number]  Chief Complaint  Patient presents with   Cough    Pt reports her persistent coughing since 11/19 with nausea and weakness. Coughing up clear phlegm. Currently c/o coughing and weakness. Denied sx of fever, bodyache, chills.    HPI Patient was seen today for ongoing concern.  Patient endorses cough with clear phlegm, nausea, weakness x 3 weeks.  Pt seen 05/22/2022 by PCP, Dr. Ardyth Harps, given amoxicillin for strep throat but had reaction to the antibiotic.  Seen again on 05/27/2022 by Dr. Clent Ridges.  Advised to take Benadryl before taking azithromycin.  Patient was taking Benadryl every 4 hours but stopped after noticing cheeks were becoming red.  Patient completed antibiotic felt better for a few days then cough returned.  Patient denies fever, chills, ear pain/pressure, facial pain/pressure, diarrhea.  Past Medical History:  Diagnosis Date   Chicken pox    Depression    Hypertension    Positive TB test    Screen for STD (sexually transmitted disease)     Allergies  Allergen Reactions   Amlodipine     Swelling unable to breathe    Amoxicillin Itching and Swelling   Codeine    Latex Swelling   Peanut-Containing Drug Products    Pistachio Nut Extract Skin Test    Tessalon [Benzonatate] Swelling    Facial swelling   Tylenol With Codeine #3 [Acetaminophen-Codeine] Hives    ROS General: Denies fever, chills, night sweats, changes in weight, changes in appetite  + fatigue HEENT: Denies headaches, ear pain, changes in vision, rhinorrhea, sore throat CV: Denies CP, palpitations, SOB, orthopnea Pulm: Denies SOB, wheezing + cough GI: Denies abdominal pain, nausea, vomiting, diarrhea, constipation GU: Denies dysuria, hematuria, frequency, vaginal discharge Msk: Denies muscle cramps, joint pains Neuro: Denies weakness, numbness, tingling Skin: Denies rashes, bruising Psych: Denies depression,  anxiety, hallucinations     Objective:    Blood pressure 130/80, pulse 72, temperature 98.6 F (37 C), temperature source Oral, weight 137 lb 3.2 oz (62.2 kg), SpO2 99 %.   Gen. Pleasant, well-nourished, in no distress, normal affect  HEENT: [place]/AT, face symmetric, conjunctiva clear, no scleral icterus, PERRLA, EOMI, nares patent without drainage, pharynx with clear postnasal drainage, no erythema or exudate.  TMs normal bilaterally. Lungs: Intermittent cough, no accessory muscle use, CTAB, no wheezes or rales Cardiovascular: RRR, no m/r/g, no peripheral edema Neuro:  A&Ox3, CN II-XII intact, normal gait Skin:  Warm, no lesions/ rash   Wt Readings from Last 3 Encounters:  06/19/22 137 lb 3.2 oz (62.2 kg)  05/27/22 136 lb (61.7 kg)  05/22/22 136 lb 8 oz (61.9 kg)    Lab Results  Component Value Date   WBC 4.1 08/15/2021   HGB 13.5 08/15/2021   HCT 40.0 08/15/2021   PLT 234.0 08/15/2021   GLUCOSE 82 08/15/2021   CHOL 189 08/15/2021   TRIG 73.0 08/15/2021   HDL 79.70 08/15/2021   LDLCALC 95 08/15/2021   ALT 13 08/15/2021   AST 19 08/15/2021   NA 138 08/15/2021   K 3.8 08/15/2021   CL 101 08/15/2021   CREATININE 0.79 08/15/2021   BUN 15 08/15/2021   CO2 29 08/15/2021   TSH 2.30 08/15/2021   HGBA1C 5.6 08/15/2021    Assessment/Plan:  Subacute cough - Plan: DG Chest 2 View  Post-nasal drainage  Patient with continued cough x 3 weeks.  Lungs clear on  exam.  Will obtain CXR to evaluate for pneumonia/bronchitis.  Postnasal drainage noted likely causing cough.  Also consider postviral cough.  Discussed saline nasal rinse, local honey, and other natural remedies for cough and drainage given patient's history to numerous medication allergies.  F/u as needed with PCP  Abbe Amsterdam, MD

## 2022-07-21 ENCOUNTER — Ambulatory Visit (INDEPENDENT_AMBULATORY_CARE_PROVIDER_SITE_OTHER): Payer: 59 | Admitting: Internal Medicine

## 2022-07-21 ENCOUNTER — Encounter: Payer: Self-pay | Admitting: Internal Medicine

## 2022-07-21 VITALS — BP 110/86 | HR 75 | Temp 98.2°F | Ht 63.0 in | Wt 137.0 lb

## 2022-07-21 DIAGNOSIS — N951 Menopausal and female climacteric states: Secondary | ICD-10-CM | POA: Diagnosis not present

## 2022-07-21 DIAGNOSIS — G4700 Insomnia, unspecified: Secondary | ICD-10-CM

## 2022-07-21 DIAGNOSIS — I10 Essential (primary) hypertension: Secondary | ICD-10-CM | POA: Diagnosis not present

## 2022-07-21 NOTE — Progress Notes (Signed)
Established Patient Office Visit     CC/Reason for Visit: Establish care, discuss chronic medical conditions  HPI: Allison Hoffman is a 52 y.o. female who is coming in today for the above mentioned reasons. Past Medical History is significant for: Hypertension treated with hydrochlorothiazide, insomnia treated with Ambien and postmenopausal vasomotor symptoms on hormone replacement therapy.  Insomnia remains her biggest issue.  She believes she has developed a lot of allergies/intolerances following a COVID vaccination.  Because of this she would like to hold off on further vaccination at this time.   Past Medical/Surgical History: Past Medical History:  Diagnosis Date   Chicken pox    Depression    Hypertension    Positive TB test    Screen for STD (sexually transmitted disease)     Past Surgical History:  Procedure Laterality Date   BREAST BIOPSY  1997   OOPHORECTOMY      Social History:  reports that she has never smoked. She has never used smokeless tobacco. She reports that she does not drink alcohol and does not use drugs.  Allergies: Allergies  Allergen Reactions   Amlodipine     Swelling unable to breathe    Amoxicillin Itching and Swelling   Codeine    Latex Swelling   Peanut-Containing Drug Products    Pistachio Nut Extract Skin Test    Tessalon [Benzonatate] Swelling    Facial swelling   Tylenol With Codeine #3 [Acetaminophen-Codeine] Hives    Family History:  Family History  Problem Relation Age of Onset   Cancer Mother    Hypertension Mother    Mental illness Mother    Kidney disease Father      Current Outpatient Medications:    EPINEPHrine 0.3 mg/0.3 mL IJ SOAJ injection, Inject 0.3 mg into the muscle as needed for anaphylaxis., Disp: 2 each, Rfl: 2   estradiol (ESTRACE) 0.5 MG tablet, estradiol 0.5 mg tablet  TAKE 1 TABLET BY MOUTH IN THE MORNING AND 1 TABLET IN THE EVENING, Disp: , Rfl:    hydrochlorothiazide (HYDRODIURIL) 12.5 MG  tablet, TK 1 T PO QAM, Disp: 30 tablet, Rfl: 1   MAGNESIUM PO, Take by mouth., Disp: , Rfl:    medroxyPROGESTERone (PROVERA) 2.5 MG tablet, medroxyprogesterone 2.5 mg tablet  TAKE 1 TABLET BY MOUTH EVERY DAY, Disp: , Rfl:    zolpidem (AMBIEN) 10 MG tablet, zolpidem 10 mg tablet  TAKE 1/2 TO 1 TABLET BY MOUTH EVERY NIGHT 20 MINUTES BEFORE BEDTIME FOR SLEEP, Disp: , Rfl:   Review of Systems:  Constitutional: Denies fever, chills, diaphoresis, appetite change and fatigue.  HEENT: Denies photophobia, eye pain, redness, hearing loss, ear pain, congestion, sore throat, rhinorrhea, sneezing, mouth sores, trouble swallowing, neck pain, neck stiffness and tinnitus.   Respiratory: Denies SOB, DOE, cough, chest tightness,  and wheezing.   Cardiovascular: Denies chest pain, palpitations and leg swelling.  Gastrointestinal: Denies nausea, vomiting, abdominal pain, diarrhea, constipation, blood in stool and abdominal distention.  Genitourinary: Denies dysuria, urgency, frequency, hematuria, flank pain and difficulty urinating.  Endocrine: Denies: hot or cold intolerance, sweats, changes in hair or nails, polyuria, polydipsia. Musculoskeletal: Denies myalgias, back pain, joint swelling, arthralgias and gait problem.  Skin: Denies pallor, rash and wound.  Neurological: Denies dizziness, seizures, syncope, weakness, light-headedness, numbness and headaches.  Hematological: Denies adenopathy. Easy bruising, personal or family bleeding history  Psychiatric/Behavioral: Denies suicidal ideation,  confusion, nervousness and agitation    Physical Exam: Vitals:   07/21/22 1410  BP:  110/86  Pulse: 75  Temp: 98.2 F (36.8 C)  TempSrc: Oral  SpO2: 100%  Weight: 137 lb (62.1 kg)  Height: 5\' 3"  (1.6 m)    Body mass index is 24.27 kg/m.   Constitutional: NAD, calm, comfortable Eyes: PERRL, lids and conjunctivae normal ENMT: Mucous membranes are moist.  Respiratory: clear to auscultation bilaterally, no  wheezing, no crackles. Normal respiratory effort. No accessory muscle use.  Cardiovascular: Regular rate and rhythm, no murmurs / rubs / gallops. No extremity edema.   Psychiatric: Normal judgment and insight. Alert and oriented x 3. Normal mood.    Impression and Plan:  Essential hypertension  Insomnia, unspecified type  Menopausal vasomotor syndrome   -Blood pressure is well-controlled.  Continue hydrochlorothiazide 25 mg daily. -She is on HRT prescribed by GYN for postmenopausal vasomotor symptoms. -We have discussed how insomnia is more often than not secondary to another disorder.  With high PHQ-9 score, I believe she might have some depression and insomnia might improve with treatment.  We discussed starting something like sertraline or Lexapro low-dose.  She will think about it and reach out to me next visit.  Flowsheet Row Office Visit from 08/15/2021 in [facility] at [place]  PHQ-9 Total Score 13        Time spent:34 minutes reviewing chart, interviewing and examining patient and formulating plan of care.      [place], MD [facility]

## 2022-12-30 ENCOUNTER — Encounter: Payer: Self-pay | Admitting: Internal Medicine

## 2022-12-30 ENCOUNTER — Ambulatory Visit (INDEPENDENT_AMBULATORY_CARE_PROVIDER_SITE_OTHER): Payer: 59 | Admitting: Internal Medicine

## 2022-12-30 VITALS — BP 120/80 | HR 65 | Temp 98.0°F | Ht 64.5 in | Wt 135.1 lb

## 2022-12-30 DIAGNOSIS — I10 Essential (primary) hypertension: Secondary | ICD-10-CM | POA: Diagnosis not present

## 2022-12-30 DIAGNOSIS — Z0000 Encounter for general adult medical examination without abnormal findings: Secondary | ICD-10-CM

## 2022-12-30 DIAGNOSIS — Z114 Encounter for screening for human immunodeficiency virus [HIV]: Secondary | ICD-10-CM

## 2022-12-30 DIAGNOSIS — E782 Mixed hyperlipidemia: Secondary | ICD-10-CM | POA: Diagnosis not present

## 2022-12-30 LAB — CBC WITH DIFFERENTIAL/PLATELET
Basophils Absolute: 0 10*3/uL (ref 0.0–0.1)
Basophils Relative: 0.5 % (ref 0.0–3.0)
Eosinophils Absolute: 0 10*3/uL (ref 0.0–0.7)
Eosinophils Relative: 0.6 % (ref 0.0–5.0)
HCT: 40.6 % (ref 36.0–46.0)
Hemoglobin: 13.5 g/dL (ref 12.0–15.0)
Lymphocytes Relative: 31.8 % (ref 12.0–46.0)
Lymphs Abs: 1.5 10*3/uL (ref 0.7–4.0)
MCHC: 33.3 g/dL (ref 30.0–36.0)
MCV: 98.7 fl (ref 78.0–100.0)
Monocytes Absolute: 0.3 10*3/uL (ref 0.1–1.0)
Monocytes Relative: 6.9 % (ref 3.0–12.0)
Neutro Abs: 2.9 10*3/uL (ref 1.4–7.7)
Neutrophils Relative %: 60.2 % (ref 43.0–77.0)
Platelets: 244 10*3/uL (ref 150.0–400.0)
RBC: 4.11 Mil/uL (ref 3.87–5.11)
RDW: 12.8 % (ref 11.5–15.5)
WBC: 4.8 10*3/uL (ref 4.0–10.5)

## 2022-12-30 LAB — COMPREHENSIVE METABOLIC PANEL
ALT: 17 U/L (ref 0–35)
AST: 21 U/L (ref 0–37)
Albumin: 4.4 g/dL (ref 3.5–5.2)
Alkaline Phosphatase: 51 U/L (ref 39–117)
BUN: 12 mg/dL (ref 6–23)
CO2: 28 mEq/L (ref 19–32)
Calcium: 9.5 mg/dL (ref 8.4–10.5)
Chloride: 99 mEq/L (ref 96–112)
Creatinine, Ser: 0.8 mg/dL (ref 0.40–1.20)
GFR: 84.51 mL/min (ref >60.00)
Glucose, Bld: 85 mg/dL (ref 70–99)
Potassium: 3.6 mEq/L (ref 3.5–5.1)
Sodium: 137 mEq/L (ref 135–145)
Total Bilirubin: 0.7 mg/dL (ref 0.2–1.2)
Total Protein: 7.7 g/dL (ref 6.0–8.3)

## 2022-12-30 LAB — LIPID PANEL
Cholesterol: 194 mg/dL (ref 0–200)
HDL: 72 mg/dL (ref >39.00)
LDL Cholesterol: 111 mg/dL — ABNORMAL HIGH (ref 0–99)
NonHDL: 122.27
Total CHOL/HDL Ratio: 3
Triglycerides: 58 mg/dL (ref 0.0–149.0)
VLDL: 11.6 mg/dL (ref 0.0–40.0)

## 2022-12-30 LAB — VITAMIN B12: Vitamin B-12: 609 pg/mL (ref 211–911)

## 2022-12-30 LAB — TSH: TSH: 1.55 u[IU]/mL (ref 0.35–5.50)

## 2022-12-30 LAB — VITAMIN D 25 HYDROXY (VIT D DEFICIENCY, FRACTURES): VITD: 35.66 ng/mL (ref 30.00–100.00)

## 2022-12-30 NOTE — Progress Notes (Signed)
Established Patient Office Visit     CC/Reason for Visit: Annual preventive exam  HPI: Allison Hoffman is a 52 y.o. female who is coming in today for the above mentioned reasons. Past Medical History is significant for: Hypertension, insomnia, postmenopausal vasomotor symptoms on hormone replacement therapy prescribed by GYN.  Depression.  She has not yet started sertraline despite recommendation to do so by myself and her GYN.  GYN actually sent a prescription for sertraline that has not been filled yet.  She will be due for her mammogram and annual well woman exam in the summer with her GYN.  She is overdue for colonoscopy.  She is overdue for COVID, flu, shingles, Tdap vaccines.   Past Medical/Surgical History: Past Medical History:  Diagnosis Date   Chicken pox    Depression    Hypertension    Positive TB test    Screen for STD (sexually transmitted disease)     Past Surgical History:  Procedure Laterality Date   BREAST BIOPSY  1997   OOPHORECTOMY      Social History:  reports that she has never smoked. She has never used smokeless tobacco. She reports that she does not drink alcohol and does not use drugs.  Allergies: Allergies  Allergen Reactions   Amlodipine     Swelling unable to breathe    Amoxicillin Itching and Swelling   Codeine    Latex Swelling   Peanut-Containing Drug Products    Pistachio Nut Extract    Tessalon [Benzonatate] Swelling    Facial swelling   Tylenol With Codeine #3 [Acetaminophen-Codeine] Hives    Family History:  Family History  Problem Relation Age of Onset   Cancer Mother    Hypertension Mother    Mental illness Mother    Kidney disease Father      Current Outpatient Medications:    EPINEPHrine 0.3 mg/0.3 mL IJ SOAJ injection, Inject 0.3 mg into the muscle as needed for anaphylaxis., Disp: 2 each, Rfl: 2   estradiol (ESTRACE) 0.5 MG tablet, estradiol 0.5 mg tablet  TAKE 1 TABLET BY MOUTH IN THE MORNING AND 1 TABLET IN  THE EVENING, Disp: , Rfl:    hydrochlorothiazide (HYDRODIURIL) 12.5 MG tablet, TK 1 T PO QAM, Disp: 30 tablet, Rfl: 1   MAGNESIUM PO, Take by mouth., Disp: , Rfl:    medroxyPROGESTERone (PROVERA) 2.5 MG tablet, medroxyprogesterone 2.5 mg tablet  TAKE 1 TABLET BY MOUTH EVERY DAY, Disp: , Rfl:    zolpidem (AMBIEN) 10 MG tablet, zolpidem 10 mg tablet  TAKE 1/2 TO 1 TABLET BY MOUTH EVERY NIGHT 20 MINUTES BEFORE BEDTIME FOR SLEEP, Disp: , Rfl:   Review of Systems:  Negative unless indicated in HPI.   Physical Exam: Vitals:   12/30/22 0935  BP: 120/80  Pulse: 65  Temp: 98 F (36.7 C)  TempSrc: Oral  SpO2: 99%  Weight: 135 lb 1.6 oz (61.3 kg)  Height: 5' 4.5" (1.638 m)    Body mass index is 22.83 kg/m.   Physical Exam Vitals reviewed.  Constitutional:      General: She is not in acute distress.    Appearance: Normal appearance. She is not ill-appearing, toxic-appearing or diaphoretic.  HENT:     Head: Normocephalic.     Right Ear: Tympanic membrane, ear canal and external ear normal. There is no impacted cerumen.     Left Ear: Tympanic membrane, ear canal and external ear normal. There is no impacted cerumen.  Nose: Nose normal.     Mouth/Throat:     Mouth: Mucous membranes are moist.     Pharynx: Oropharynx is clear. No oropharyngeal exudate or posterior oropharyngeal erythema.  Eyes:     General: No scleral icterus.       Right eye: No discharge.        Left eye: No discharge.     Conjunctiva/sclera: Conjunctivae normal.     Pupils: Pupils are equal, round, and reactive to light.  Neck:     Vascular: No carotid bruit.  Cardiovascular:     Rate and Rhythm: Normal rate and regular rhythm.     Pulses: Normal pulses.     Heart sounds: Normal heart sounds.  Pulmonary:     Effort: Pulmonary effort is normal. No respiratory distress.     Breath sounds: Normal breath sounds.  Abdominal:     General: Abdomen is flat. Bowel sounds are normal.     Palpations: Abdomen is  soft.  Musculoskeletal:        General: Normal range of motion.     Cervical back: Normal range of motion.  Skin:    General: Skin is warm and dry.  Neurological:     General: No focal deficit present.     Mental Status: She is alert and oriented to person, place, and time. Mental status is at baseline.  Psychiatric:        Mood and Affect: Mood normal.        Behavior: Behavior normal.        Thought Content: Thought content normal.        Judgment: Judgment normal.    Flowsheet Row Office Visit from 12/30/2022 in [facility] at [place]  PHQ-9 Total Score 7        Impression and Plan:  Encounter for preventive health examination  Essential hypertension -     CBC with Differential/Platelet; Future -     Comprehensive metabolic panel; Future  Well adult exam  Mixed hyperlipidemia -     Lipid panel; Future -     Vitamin B12; Future -     TSH; Future -     VITAMIN D 25 Hydroxy (Vit-D Deficiency, Fractures); Future  Encounter for screening for HIV -     HIV Antibody (routine testing w rflx); Future    -Recommend routine eye and dental care. -Healthy lifestyle discussed in detail. -Labs to be updated today. -Prostate cancer screening: Not applicable Health Maintenance  Topic Date Due   HIV Screening  Never done   DTaP/Tdap/Td vaccine (1 - Tdap) Never done   COVID-19 Vaccine (3 - 2023-24 season) 03/14/2022   Zoster (Shingles) Vaccine (1 of 2) 04/01/2023*   Colon Cancer Screening  07/22/2023*   Flu Shot  02/12/2023   Mammogram  01/07/2024   Pap Smear  01/06/2025   Hepatitis C Screening  Completed   HPV Vaccine  Aged Out  *Topic was postponed. The date shown is not the original due date.    -Declines all vaccinations despite counseling. -She prefers to postpone colonoscopy today.  She will have a mammogram with GYN next month.    Chaya Jan, MD [facility] at [facility]

## 2022-12-31 LAB — HIV ANTIBODY (ROUTINE TESTING W REFLEX): HIV 1&2 Ab, 4th Generation: NONREACTIVE

## 2023-01-02 ENCOUNTER — Encounter: Payer: Self-pay | Admitting: Internal Medicine

## 2023-01-19 LAB — HM MAMMOGRAPHY

## 2023-07-30 ENCOUNTER — Ambulatory Visit: Payer: 59 | Admitting: Internal Medicine

## 2023-07-31 ENCOUNTER — Ambulatory Visit: Payer: Self-pay | Admitting: Internal Medicine

## 2023-07-31 NOTE — Telephone Encounter (Signed)
  Chief Complaint: Left leg injury Symptoms: intermittent swelling from ankle to shin Frequency: since Sunday Pertinent Negatives: Patient denies weakness, pain, interruption of ADL's Disposition: [] ED /[] Urgent Care (no appt availability in office) / [] Appointment(In office/virtual)/ []  [facility]/ [x] Home Care/ [] Refused Recommended Disposition /[] [facility]/ []  Follow-up with PCP Additional Notes: This RN returned patient's call in regards to left leg injury that occurred on Sunday. Patient reports she was carrying a case of water and slipped on ice, her left leg went behind her and this is the leg that is causing her trouble. Patient states there is no pain, and there was no immediate swelling, but on Wednesday she has noticed intermittent swelling throughout the day. Patient denies any open lacerations, bruising, redness, difficulty walking. Advised patient on home care instructions and to call in if swelling does not seem to be improving by 7 days.    Copied from CRM [id number]. Topic: Clinical - Pink Word Triage >> Jul 31, 2023  1:19 PM Chantha C wrote: Reason for Triage: Patient [phone] states fell on the ice Sunday, leg seemed fine, yesterday swelling left leg bottom. Patient denies pain. Reason for Disposition  [1] Minor injury or pain from twisting or over-stretching AND [2] walking normally  Answer Assessment - Initial Assessment Questions 1. MECHANISM: "How did the injury happen?" (e.g., twisting injury, direct blow)      Sunday - slipped on ice while carrying case of water 2. ONSET: "When did the injury happen?" (Minutes or hours ago)      Sunday 3. LOCATION: "Where is the injury located?"      Left leg, shin and ankle area 4. APPEARANCE of INJURY: "What does the injury look like?"  (e.g., deformity of leg)     Visible swelling 5. SEVERITY: "Can you put weight on that leg?" "Can you walk?"      Yes 6. SIZE: For cuts, bruises, or swelling, ask: "How  large is it?" (e.g., inches or centimeters)      No open cuts 7. PAIN: "Is there pain?" If Yes, ask: "How bad is the pain?"   "What does it keep you from doing?" (e.g., Scale 1-10; or mild, moderate, severe)   -  NONE: (0): no pain   -  MILD (1-3): doesn't interfere with normal activities    -  MODERATE (4-7): interferes with normal activities (e.g., work or school) or awakens from sleep, limping    -  SEVERE (8-10): excruciating pain, unable to do any normal activities, unable to walk     No pain 8. TETANUS: For any breaks in the skin, ask: "When was the last tetanus booster?"     No breaks in skin 9. OTHER SYMPTOMS: "Do you have any other symptoms?"      No other symptoms  Protocols used: Leg Injury-A-AH

## 2023-11-11 ENCOUNTER — Encounter (HOSPITAL_COMMUNITY): Payer: Self-pay

## 2023-11-11 ENCOUNTER — Emergency Department (HOSPITAL_COMMUNITY): Admission: EM | Admit: 2023-11-11 | Discharge: 2023-11-11 | Discharge status: Against Medical Advice

## 2023-11-11 ENCOUNTER — Ambulatory Visit (HOSPITAL_COMMUNITY)
Admission: EM | Admit: 2023-11-11 | Discharge: 2023-11-11 | Disposition: A | Discharge status: Home / Self Care | Attending: Family Medicine | Admitting: Family Medicine

## 2023-11-11 ENCOUNTER — Emergency Department (HOSPITAL_COMMUNITY)
Admission: EM | Admit: 2023-11-11 | Discharge: 2023-11-12 | Disposition: A | Discharge status: Home / Self Care | Attending: Emergency Medicine | Admitting: Emergency Medicine

## 2023-11-11 ENCOUNTER — Other Ambulatory Visit: Payer: Self-pay

## 2023-11-11 DIAGNOSIS — M79604 Pain in right leg: Secondary | ICD-10-CM | POA: Diagnosis not present

## 2023-11-11 DIAGNOSIS — Z91040 Latex allergy status: Secondary | ICD-10-CM | POA: Diagnosis not present

## 2023-11-11 DIAGNOSIS — I10 Essential (primary) hypertension: Secondary | ICD-10-CM | POA: Insufficient documentation

## 2023-11-11 DIAGNOSIS — M7989 Other specified soft tissue disorders: Secondary | ICD-10-CM

## 2023-11-11 DIAGNOSIS — Z91010 Allergy to peanuts: Secondary | ICD-10-CM | POA: Insufficient documentation

## 2023-11-11 DIAGNOSIS — Z79899 Other long term (current) drug therapy: Secondary | ICD-10-CM | POA: Diagnosis not present

## 2023-11-11 DIAGNOSIS — M7121 Synovial cyst of popliteal space [Baker], right knee: Secondary | ICD-10-CM | POA: Diagnosis not present

## 2023-11-11 DIAGNOSIS — R2241 Localized swelling, mass and lump, right lower limb: Secondary | ICD-10-CM | POA: Insufficient documentation

## 2023-11-11 DIAGNOSIS — M79661 Pain in right lower leg: Secondary | ICD-10-CM

## 2023-11-11 LAB — CBC
HCT: 40.3 % (ref 36.0–46.0)
Hemoglobin: 13.9 g/dL (ref 12.0–15.0)
MCH: 33.1 pg (ref 26.0–34.0)
MCHC: 34.5 g/dL (ref 30.0–36.0)
MCV: 96 fL (ref 80.0–100.0)
Platelets: 278 10*3/uL (ref 150–400)
RBC: 4.2 MIL/uL (ref 3.87–5.11)
RDW: 12 % (ref 11.5–15.5)
WBC: 6.2 10*3/uL (ref 4.0–10.5)
nRBC: 0 % (ref 0.0–0.2)

## 2023-11-11 LAB — BASIC METABOLIC PANEL WITH GFR
Anion gap: 10 (ref 5–15)
BUN: 16 mg/dL (ref 6–20)
CO2: 26 mmol/L (ref 22–32)
Calcium: 9.2 mg/dL (ref 8.9–10.3)
Chloride: 101 mmol/L (ref 98–111)
Creatinine, Ser: 0.73 mg/dL (ref 0.44–1.00)
GFR, Estimated: >60 mL/min (ref >60)
Glucose, Bld: 99 mg/dL (ref 70–99)
Potassium: 3.5 mmol/L (ref 3.5–5.1)
Sodium: 137 mmol/L (ref 135–145)

## 2023-11-11 NOTE — ED Provider Triage Note (Signed)
 Emergency Medicine Provider Triage Evaluation Note  Allison Hoffman , a 53 y.o. female  was evaluated in triage.  Pt complains of leg swelling. Report pain and swelling to RLE since yesterday.  Report recent flight to [place]  to visit her daughter.  No hx of PE/DVT.  Denies any injury  Review of Systems  Positive: As above Negative: As above  Physical Exam  BP (!) 146/98   Pulse 71   Temp 98 F (36.7 C)   Resp 16   Ht 5\' 4"  (1.626 m)   Wt 61 kg   LMP 07/25/2020 (Exact Date)   SpO2 100%   BMI 23.08 kg/m  Gen:   Awake, no distress   Resp:  Normal effort  MSK:   Moves extremities without difficulty  Other:    Medical Decision Making  Medically screening exam initiated at 10:09 PM.  Appropriate orders placed.  Allison Hoffman was informed that the remainder of the evaluation will be completed by another provider, this initial triage assessment does not replace that evaluation, and the importance of remaining in the ED until their evaluation is complete.     Allison Fairy, PA-C 11/11/23 2210

## 2023-11-11 NOTE — ED Notes (Addendum)
 Called patient for triage x 1 - no response

## 2023-11-11 NOTE — ED Triage Notes (Signed)
 Patient presents with right lower calf pain and swelling x 2 days. Patient denies any recent trauma.

## 2023-11-11 NOTE — ED Triage Notes (Signed)
 Patient c/o right leg swelling x 2 days. Patient report she just flew back from vacation yesterday and notice worsening edeme on right leg. Patinet denies Chest pain and SOB. Patient denies N/V.

## 2023-11-11 NOTE — ED Notes (Addendum)
 Called patient x 2 for triage - no response

## 2023-11-11 NOTE — ED Notes (Signed)
 Called patient x 4- no response. Pulled OTF.

## 2023-11-11 NOTE — ED Notes (Signed)
 Patient is being discharged from the Urgent Care and sent to the Emergency Department via POC . Per Dr. Barbar Bonus, patient is in need of higher level of care due to need of further evaluation. Patient is aware and verbalizes understanding of plan of care.  Vitals:   11/11/23 2002  BP: (!) 172/102  Pulse: 87  Resp: 16  Temp: 98.3 F (36.8 C)  SpO2: 99%

## 2023-11-12 ENCOUNTER — Ambulatory Visit (HOSPITAL_BASED_OUTPATIENT_CLINIC_OR_DEPARTMENT_OTHER)
Admission: RE | Admit: 2023-11-12 | Discharge: 2023-11-12 | Disposition: A | Discharge status: Home / Self Care | Source: Ambulatory Visit | Attending: Emergency Medicine | Admitting: Emergency Medicine

## 2023-11-12 DIAGNOSIS — M79604 Pain in right leg: Secondary | ICD-10-CM | POA: Insufficient documentation

## 2023-11-12 DIAGNOSIS — M7989 Other specified soft tissue disorders: Secondary | ICD-10-CM | POA: Insufficient documentation

## 2023-11-12 DIAGNOSIS — M7121 Synovial cyst of popliteal space [Baker], right knee: Secondary | ICD-10-CM | POA: Insufficient documentation

## 2023-11-12 LAB — CK: Total CK: 134 U/L (ref 38–234)

## 2023-11-12 MED ORDER — ENOXAPARIN SODIUM 60 MG/0.6ML IJ SOSY
1.0000 mg/kg | PREFILLED_SYRINGE | Freq: Once | INTRAMUSCULAR | Status: AC
  Administered 2023-11-12: 60 mg via SUBCUTANEOUS
  Filled 2023-11-12: qty 0.6

## 2023-11-12 NOTE — Discharge Instructions (Signed)
 You need to return as per instructions to [facility] for right lower extremity ultrasound later today.

## 2023-11-12 NOTE — ED Provider Notes (Signed)
 [place] EMERGENCY DEPARTMENT AT [facility] Provider Note   CSN: [id number] Arrival date & time: 11/11/23  2105     History  Chief Complaint  Patient presents with   Leg Swelling    Allison Hoffman is a 53 y.o. female.  HPI     This is a 53 year old female who presents with right leg pain and swelling.  Patient reports that she recently was on an airplane traveling to see a family member.  Over the last 24 hours she has had increased swelling of the right calf.  She does workout and states that she had difficulty finishing a set of squats yesterday.  She states that her workouts were not out of the ordinary for her.  She has not had any chest pain or shortness of breath.  No known history of blood clots.  Home Medications Prior to Admission medications   Medication Sig Start Date End Date Taking? Authorizing Provider  EPINEPHrine  0.3 mg/0.3 mL IJ SOAJ injection Inject 0.3 mg into the muscle as needed for anaphylaxis. 08/03/20   Viola Greulich, MD  estradiol (ESTRACE) 0.5 MG tablet estradiol 0.5 mg tablet  TAKE 1 TABLET BY MOUTH IN THE MORNING AND 1 TABLET IN THE EVENING    [provider]  hydrochlorothiazide  (HYDRODIURIL ) 12.5 MG tablet TK 1 T PO QAM 09/15/18   Viola Greulich, MD  MAGNESIUM PO Take by mouth.    [provider]  medroxyPROGESTERone (PROVERA) 2.5 MG tablet medroxyprogesterone 2.5 mg tablet  TAKE 1 TABLET BY MOUTH EVERY DAY    [provider]  zolpidem (AMBIEN) 10 MG tablet zolpidem 10 mg tablet  TAKE 1/2 TO 1 TABLET BY MOUTH EVERY NIGHT 20 MINUTES BEFORE BEDTIME FOR SLEEP    [provider]      Allergies    Amlodipine, Amoxicillin , Codeine, Latex, Peanut-containing drug products, Pistachio nut extract, Tessalon  [benzonatate ], and Tylenol with codeine #3 [acetaminophen-codeine]    Review of Systems   Review of Systems  Constitutional:  Negative for fever.  Respiratory:  Negative for shortness of breath.    Cardiovascular:  Positive for leg swelling. Negative for chest pain.  All other systems reviewed and are negative.   Physical Exam Updated Vital Signs BP 119/81   Pulse (!) 56   Temp 98 F (36.7 C) (Oral)   Resp 17   Ht 1.626 m (5\' 4" )   Wt 61 kg   LMP 07/25/2020 (Exact Date)   SpO2 99%   BMI 23.08 kg/m  Physical Exam Vitals and nursing note reviewed.  Constitutional:      Appearance: She is well-developed.  HENT:     Head: Normocephalic and atraumatic.  Eyes:     Pupils: Pupils are equal, round, and reactive to light.  Cardiovascular:     Rate and Rhythm: Normal rate and regular rhythm.     Heart sounds: Normal heart sounds.  Pulmonary:     Effort: Pulmonary effort is normal. No respiratory distress.     Breath sounds: No wheezing.  Abdominal:     Palpations: Abdomen is soft.  Musculoskeletal:     Cervical back: Neck supple.     Comments: Slight asymmetric swelling of the calves right greater than left, tenderness to palpation right calf  Skin:    General: Skin is warm and dry.  Neurological:     Mental Status: She is alert and oriented to person, place, and time.  Psychiatric:  Mood and Affect: Mood normal.     ED Results / Procedures / Treatments   Labs (all labs ordered are listed, but only abnormal results are displayed) Labs Reviewed  BASIC METABOLIC PANEL WITH GFR  CBC  CK    EKG EKG Interpretation Date/Time:  Thursday Nov 12 2023 01:17:16 EDT Ventricular Rate:  64 PR Interval:  156 QRS Duration:  100 QT Interval:  446 QTC Calculation: 461 R Axis:   59  Text Interpretation: Sinus rhythm Consider left atrial enlargement RSR' in V1 or V2, right VCD or RVH Confirmed by Donita Furrow ([id number]) on 11/12/2023 1:20:06 AM  Radiology No results found.  Procedures Procedures    Medications Ordered in ED Medications  enoxaparin  (LOVENOX ) injection 60 mg (60 mg Subcutaneous Given 11/12/23 0132)    ED Course/ Medical Decision Making/ A&P                                  Medical Decision Making Amount and/or Complexity of Data Reviewed Labs: ordered.  Risk Prescription drug management.   This patient presents to the ED for concern of leg swelling, this involves an extensive number of treatment options, and is a complaint that carries with it a high risk of complications and morbidity.  I considered the following differential and admission for this acute, potentially life threatening condition.  The differential diagnosis includes DVT, rhabdo, cellulitis  MDM:    This is a 53 year old female who presents with right leg swelling.  She is overall nontoxic-appearing and vital signs are reassuring.  She does have asymmetric swelling of the right calf with tenderness to palpation.  Recent travel.  Blood clot is certainly a consideration.  She also works out and has had pain with squatting.  Will check a CK.  Basic lab work is reassuring.  Patient was given a dose of Lovenox  to cover for DVT.  CK is normal.  Patient will return for ultrasound imaging later today.  (Labs, imaging, consults)  Labs: I Ordered, and personally interpreted labs.  The pertinent results include: CBC, BMP, CK  Imaging Studies ordered:  I ordered imaging studies including none I independently visualized and interpreted imaging. I agree with the radiologist interpretation  Additional history obtained from chart review.  External records from outside source obtained and reviewed including evaluations  Cardiac Monitoring: The patient was not maintained on a cardiac monitor.  If on the cardiac monitor, I personally viewed and interpreted the cardiac monitored which showed an underlying rhythm of: N/A  Reevaluation: After the interventions noted above, I reevaluated the patient and found that they have :stayed the same  Social Determinants of Health:  lives independently  Disposition: Discharge  Co morbidities that complicate the patient  evaluation  Past Medical History:  Diagnosis Date   Chicken pox    Depression    Hypertension    Positive TB test    Screen for STD (sexually transmitted disease)      Medicines Meds ordered this encounter  Medications   enoxaparin  (LOVENOX ) injection 60 mg    I have reviewed the patients home medicines and have made adjustments as needed  Problem List / ED Course: Problem List Items Addressed This Visit   None Visit Diagnoses       Leg swelling    -  Primary          ,mdm        Final Clinical  Impression(s) / ED Diagnoses Final diagnoses:  Leg swelling    Rx / DC Orders ED Discharge Orders          Ordered    LE VENOUS        11/12/23 0456              Rory Collard, MD 11/12/23 [phone]

## 2023-11-14 NOTE — ED Provider Notes (Signed)
  [facility]   [id number] 11/11/23 Arrival Time: 1902  ASSESSMENT & PLAN:  1. Pain of right calf   2. Pain and swelling of right lower extremity   3. Elevated blood pressure reading with diagnosis of hypertension    Pt wants to r/o DVT this evening; wishes to proceed to ED; by POV; stable upon discharge. Noted increased BP.  Reviewed expectations re: course of current medical issues. Questions answered. Outlined signs and symptoms indicating need for more acute intervention. Understanding verbalized. After Visit Summary given.   SUBJECTIVE: History from: Patient. Allison Hoffman is a 53 y.o. female. Patient presents with right lower calf pain and swelling x 2 days. Patient denies any recent trauma. Recent air travel; worried over possibility of DVT. Denies CP/SOB.  OBJECTIVE:  Vitals:   11/11/23 2002  BP: (!) 172/102  Pulse: 87  Resp: 16  Temp: 98.3 F (36.8 C)  TempSrc: Oral  SpO2: 99%    General appearance: alert; no distress Lungs: speaks full sentences without difficulty; unlabored; clear CV: reg L calf is swollen and slightly taught compared to R Psychological: alert and cooperative; normal mood and affect    Allergies  Allergen Reactions   Amlodipine     Swelling unable to breathe    Amoxicillin  Itching and Swelling   Codeine    Latex Swelling   Peanut-Containing Drug Products    Pistachio Nut Extract    Tessalon  [Benzonatate ] Swelling    Facial swelling   Tylenol With Codeine #3 [Acetaminophen-Codeine] Hives    Past Medical History:  Diagnosis Date   Chicken pox    Depression    Hypertension    Positive TB test    Screen for STD (sexually transmitted disease)    Social History   Socioeconomic History   Marital status: Married    Spouse name: Not on file   Number of children: Not on file   Years of education: Not on file   Highest education level: Not on file  Occupational History   Not on file  Tobacco Use   Smoking status:  Never   Smokeless tobacco: Never  Substance and Sexual Activity   Alcohol use: No   Drug use: No   Sexual activity: Yes  Other Topics Concern   Not on file  Social History Narrative   Not on file   Social Drivers of Health   Financial Resource Strain: Not on file  Food Insecurity: Not on file  Transportation Needs: Not on file  Physical Activity: Not on file  Stress: Not on file  Social Connections: Not on file  Intimate Partner Violence: Not on file   Family History  Problem Relation Age of Onset   Cancer Mother    Hypertension Mother    Mental illness Mother    Kidney disease Father    Past Surgical History:  Procedure Laterality Date   BREAST BIOPSY  1997   Nelle Ban, MD 11/14/23 1002

## 2024-01-07 ENCOUNTER — Encounter: Payer: Self-pay | Admitting: Internal Medicine

## 2024-01-07 ENCOUNTER — Ambulatory Visit: Payer: 59 | Admitting: Internal Medicine

## 2024-01-07 ENCOUNTER — Ambulatory Visit: Payer: Self-pay | Admitting: Internal Medicine

## 2024-01-07 VITALS — BP 120/88 | HR 70 | Temp 98.0°F | Ht 65.0 in | Wt 140.5 lb

## 2024-01-07 DIAGNOSIS — E782 Mixed hyperlipidemia: Secondary | ICD-10-CM

## 2024-01-07 DIAGNOSIS — I10 Essential (primary) hypertension: Secondary | ICD-10-CM | POA: Diagnosis not present

## 2024-01-07 DIAGNOSIS — Z0000 Encounter for general adult medical examination without abnormal findings: Secondary | ICD-10-CM | POA: Diagnosis not present

## 2024-01-07 DIAGNOSIS — F411 Generalized anxiety disorder: Secondary | ICD-10-CM

## 2024-01-07 DIAGNOSIS — E559 Vitamin D deficiency, unspecified: Secondary | ICD-10-CM

## 2024-01-07 LAB — CBC WITH DIFFERENTIAL/PLATELET
Basophils Absolute: 0 10*3/uL (ref 0.0–0.1)
Basophils Relative: 0.7 % (ref 0.0–3.0)
Eosinophils Absolute: 0.1 10*3/uL (ref 0.0–0.7)
Eosinophils Relative: 1.9 % (ref 0.0–5.0)
HCT: 40.2 % (ref 36.0–46.0)
Hemoglobin: 13.6 g/dL (ref 12.0–15.0)
Lymphocytes Relative: 40.7 % (ref 12.0–46.0)
Lymphs Abs: 1.7 10*3/uL (ref 0.7–4.0)
MCHC: 33.7 g/dL (ref 30.0–36.0)
MCV: 97 fl (ref 78.0–100.0)
Monocytes Absolute: 0.4 10*3/uL (ref 0.1–1.0)
Monocytes Relative: 9 % (ref 3.0–12.0)
Neutro Abs: 2 10*3/uL (ref 1.4–7.7)
Neutrophils Relative %: 47.7 % (ref 43.0–77.0)
Platelets: 234 10*3/uL (ref 150.0–400.0)
RBC: 4.15 Mil/uL (ref 3.87–5.11)
RDW: 12.8 % (ref 11.5–15.5)
WBC: 4.1 10*3/uL (ref 4.0–10.5)

## 2024-01-07 LAB — COMPREHENSIVE METABOLIC PANEL WITH GFR
ALT: 17 U/L (ref 0–35)
AST: 19 U/L (ref 0–37)
Albumin: 4.2 g/dL (ref 3.5–5.2)
Alkaline Phosphatase: 50 U/L (ref 39–117)
BUN: 14 mg/dL (ref 6–23)
CO2: 29 meq/L (ref 19–32)
Calcium: 9.2 mg/dL (ref 8.4–10.5)
Chloride: 100 meq/L (ref 96–112)
Creatinine, Ser: 0.76 mg/dL (ref 0.40–1.20)
GFR: 89.23 mL/min (ref >60.00)
Glucose, Bld: 82 mg/dL (ref 70–99)
Potassium: 3.5 meq/L (ref 3.5–5.1)
Sodium: 136 meq/L (ref 135–145)
Total Bilirubin: 0.7 mg/dL (ref 0.2–1.2)
Total Protein: 7.1 g/dL (ref 6.0–8.3)

## 2024-01-07 LAB — LIPID PANEL
Cholesterol: 210 mg/dL — ABNORMAL HIGH (ref 0–200)
HDL: 76 mg/dL (ref >39.00)
LDL Cholesterol: 123 mg/dL — ABNORMAL HIGH (ref 0–99)
NonHDL: 133.66
Total CHOL/HDL Ratio: 3
Triglycerides: 53 mg/dL (ref 0.0–149.0)
VLDL: 10.6 mg/dL (ref 0.0–40.0)

## 2024-01-07 LAB — TSH: TSH: 1.75 u[IU]/mL (ref 0.35–5.50)

## 2024-01-07 LAB — VITAMIN D 25 HYDROXY (VIT D DEFICIENCY, FRACTURES): VITD: 29.91 ng/mL — ABNORMAL LOW (ref 30.00–100.00)

## 2024-01-07 LAB — VITAMIN B12: Vitamin B-12: 664 pg/mL (ref 211–911)

## 2024-01-07 MED ORDER — VITAMIN D (ERGOCALCIFEROL) 1.25 MG (50000 UNIT) PO CAPS: 50000.0000 [IU] | ORAL_CAPSULE | ORAL | 0 refills | Status: AC

## 2024-01-07 NOTE — Progress Notes (Signed)
 Established Patient Office Visit     CC/Reason for Visit: Annual preventive exam  HPI: Allison Hoffman is a 53 y.o. female who is coming in today for the above mentioned reasons. Past Medical History is significant for: Hypertension and generalized anxiety.  Still struggling with sleeping issues.  Otherwise feels well.  Has routine dental care, is overdue for an eye exam.  Is due for colon cancer screening.  Is due for Tdap and shingles vaccines.   Past Medical/Surgical History: Past Medical History:  Diagnosis Date   Chicken pox    Depression    Hypertension    Positive TB test    Screen for STD (sexually transmitted disease)     Past Surgical History:  Procedure Laterality Date   BREAST BIOPSY  1997   OOPHORECTOMY      Social History:  reports that she has never smoked. She has never used smokeless tobacco. She reports that she does not drink alcohol and does not use drugs.  Allergies: Allergies  Allergen Reactions   Amlodipine     Swelling unable to breathe    Amoxicillin  Itching and Swelling   Codeine    Latex Swelling   Peanut-Containing Drug Products    Pistachio Nut Extract    Tessalon  [Benzonatate ] Swelling    Facial swelling   Tylenol With Codeine #3 [Acetaminophen-Codeine] Hives    Family History:  Family History  Problem Relation Age of Onset   Cancer Mother    Hypertension Mother    Mental illness Mother    Kidney disease Father      Current Outpatient Medications:    EPINEPHrine  0.3 mg/0.3 mL IJ SOAJ injection, Inject 0.3 mg into the muscle as needed for anaphylaxis., Disp: 2 each, Rfl: 2   estradiol (ESTRACE) 0.5 MG tablet, estradiol 0.5 mg tablet  TAKE 1 TABLET BY MOUTH IN THE MORNING AND 1 TABLET IN THE EVENING, Disp: , Rfl:    hydrochlorothiazide  (HYDRODIURIL ) 12.5 MG tablet, TK 1 T PO QAM, Disp: 30 tablet, Rfl: 1   medroxyPROGESTERone (PROVERA) 2.5 MG tablet, medroxyprogesterone 2.5 mg tablet  TAKE 1 TABLET BY MOUTH EVERY DAY, Disp:  , Rfl:    zolpidem (AMBIEN) 10 MG tablet, zolpidem 10 mg tablet  TAKE 1/2 TO 1 TABLET BY MOUTH EVERY NIGHT 20 MINUTES BEFORE BEDTIME FOR SLEEP, Disp: , Rfl:    MAGNESIUM PO, Take by mouth., Disp: , Rfl:   Review of Systems:  Negative unless indicated in HPI.   Physical Exam: Vitals:   01/07/24 0804  BP: 120/88  Pulse: 70  Temp: 98 F (36.7 C)  TempSrc: Oral  SpO2: 100%  Weight: 140 lb 8 oz (63.7 kg)  Height: 5' 5 (1.651 m)    Body mass index is 23.38 kg/m.   Physical Exam Vitals reviewed.  Constitutional:      General: She is not in acute distress.    Appearance: Normal appearance. She is not ill-appearing, toxic-appearing or diaphoretic.  HENT:     Head: Normocephalic.     Right Ear: Tympanic membrane, ear canal and external ear normal. There is no impacted cerumen.     Left Ear: Tympanic membrane, ear canal and external ear normal. There is no impacted cerumen.     Nose: Nose normal.     Mouth/Throat:     Mouth: Mucous membranes are moist.     Pharynx: Oropharynx is clear. No oropharyngeal exudate or posterior oropharyngeal erythema.   Eyes:     General:  No scleral icterus.       Right eye: No discharge.        Left eye: No discharge.     Conjunctiva/sclera: Conjunctivae normal.     Pupils: Pupils are equal, round, and reactive to light.   Neck:     Vascular: No carotid bruit.   Cardiovascular:     Rate and Rhythm: Normal rate and regular rhythm.     Pulses: Normal pulses.     Heart sounds: Normal heart sounds.  Pulmonary:     Effort: Pulmonary effort is normal. No respiratory distress.     Breath sounds: Normal breath sounds.  Abdominal:     General: Abdomen is flat. Bowel sounds are normal.     Palpations: Abdomen is soft.   Musculoskeletal:        General: Normal range of motion.     Cervical back: Normal range of motion.   Skin:    General: Skin is warm and dry.   Neurological:     General: No focal deficit present.     Mental Status: She is  alert and oriented to person, place, and time. Mental status is at baseline.   Psychiatric:        Mood and Affect: Mood normal.        Behavior: Behavior normal.        Thought Content: Thought content normal.        Judgment: Judgment normal.     Flowsheet Row Office Visit from 01/07/2024 in [facility] [place]  PHQ-9 Total Score 9     Impression and Plan:  Encounter for preventive health examination  Essential hypertension -     CBC with Differential/Platelet; Future -     Comprehensive metabolic panel with GFR; Future -     TSH; Future -     Vitamin B12; Future -     VITAMIN D  25 Hydroxy (Vit-D Deficiency, Fractures); Future  Mixed hyperlipidemia -     Lipid panel; Future  GAD (generalized anxiety disorder)  -Recommend routine eye and dental care. -Healthy lifestyle discussed in detail. -Labs to be updated today. -Prostate cancer screening: N/A Health Maintenance  Topic Date Due   Hepatitis B Vaccine (1 of 3 - 19+ 3-dose series) Never done   COVID-19 Vaccine (3 - 2024-25 season) 03/15/2023   Zoster (Shingles) Vaccine (1 of 2) 04/08/2024*   DTaP/Tdap/Td vaccine (1 - Tdap) 01/06/2025*   Colon Cancer Screening  01/06/2025*   Flu Shot  02/12/2024   Pap with HPV screening  01/06/2025   Mammogram  01/18/2025   Hepatitis C Screening  Completed   HIV Screening  Completed   HPV Vaccine  Aged Out   Meningitis B Vaccine  Aged Out  *Topic was postponed. The date shown is not the original due date.      Declines updated immunizations and colonoscopy despite counseling.    Tully Theophilus Andrews, MD [facility]

## 2024-01-08 ENCOUNTER — Encounter: Payer: Self-pay | Admitting: Internal Medicine

## 2024-01-10 ENCOUNTER — Encounter: Payer: Self-pay | Admitting: Internal Medicine

## 2024-01-29 LAB — HM MAMMOGRAPHY

## 2024-02-01 ENCOUNTER — Ambulatory Visit: Payer: Self-pay

## 2024-02-01 NOTE — Telephone Encounter (Signed)
                                       FYI Only or Action Required?: FYI only for provider.  Patient was last seen in primary care on 01/07/2024 by Theophilus Andrews, Tully GRADE, MD.  Called Nurse Triage reporting cracking in ear. Pt got conditioner in ear.  Symptoms began a week ago.  Interventions attempted: Other: q tip.  Symptoms are: unchanged.  Triage Disposition: No disposition on file.  Patient/caregiver understands and will follow disposition?:yes                   Copied from CRM [id number]. Topic: Clinical - Red Word Triage >> Feb 01, 2024 11:51 AM Larissa RAMAN wrote: [facility] that prompted transfer to Nurse Triage: RT ear pain/ infection Answer Assessment - Initial Assessment Questions 1. LOCATION: Which ear is involved?     Right ear 2. ONSET: When did the ear pain start?      No - crackling sound in ear 3. SEVERITY: How bad is the pain?  (Scale 1-10; mild, moderate or severe)     No pain 4. URI SYMPTOMS: Do you have a runny nose or cough?     no 5. FEVER: Do you have a fever? If Yes, ask: What is your temperature, how was it measured, and when did it start?     no 6. CAUSE: Have you been swimming recently?, How often do you use Q-TIPS?, Have you had any recent air travel or scuba diving?     Got conditioner is ear 7. OTHER SYMPTOMS: Do you have any other symptoms? (e.g., decreased hearing, dizziness, headache, stiff neck, vomiting)     no  Protocols used: Earache-A-AH

## 2024-02-01 NOTE — Telephone Encounter (Signed)
 noted

## 2024-02-02 ENCOUNTER — Encounter: Admitting: Internal Medicine

## 2024-02-02 ENCOUNTER — Encounter: Payer: Self-pay | Admitting: Obstetrics & Gynecology

## 2024-02-02 ENCOUNTER — Encounter: Payer: Self-pay | Admitting: Internal Medicine

## 2024-02-03 NOTE — Progress Notes (Signed)
 This encounter was created in error - please disregard.

## 2024-04-18 ENCOUNTER — Ambulatory Visit: Payer: Self-pay | Admitting: Internal Medicine

## 2024-04-18 ENCOUNTER — Other Ambulatory Visit (INDEPENDENT_AMBULATORY_CARE_PROVIDER_SITE_OTHER)

## 2024-04-18 DIAGNOSIS — E559 Vitamin D deficiency, unspecified: Secondary | ICD-10-CM

## 2024-04-18 LAB — VITAMIN D 25 HYDROXY (VIT D DEFICIENCY, FRACTURES): VITD: 50.85 ng/mL (ref 30.00–100.00)

## 2024-05-02 ENCOUNTER — Encounter: Payer: Self-pay | Admitting: Internal Medicine

## 2024-05-02 DIAGNOSIS — L918 Other hypertrophic disorders of the skin: Secondary | ICD-10-CM
